# Patient Record
Sex: Female | Born: 1950 | Race: White | Hispanic: No | Marital: Married
Health system: Midwestern US, Community
[De-identification: ages and names within clinical notes are randomized; demographics above are authoritative.]

## PROBLEM LIST (undated history)

## (undated) DIAGNOSIS — E7800 Pure hypercholesterolemia, unspecified: Secondary | ICD-10-CM

## (undated) DIAGNOSIS — N390 Urinary tract infection, site not specified: Secondary | ICD-10-CM

## (undated) DIAGNOSIS — M329 Systemic lupus erythematosus, unspecified: Secondary | ICD-10-CM

## (undated) DIAGNOSIS — K912 Postsurgical malabsorption, not elsewhere classified: Secondary | ICD-10-CM

## (undated) DIAGNOSIS — E538 Deficiency of other specified B group vitamins: Secondary | ICD-10-CM

## (undated) DIAGNOSIS — R079 Chest pain, unspecified: Secondary | ICD-10-CM

## (undated) DIAGNOSIS — E559 Vitamin D deficiency, unspecified: Secondary | ICD-10-CM

## (undated) DIAGNOSIS — R5383 Other fatigue: Secondary | ICD-10-CM

## (undated) DIAGNOSIS — E785 Hyperlipidemia, unspecified: Secondary | ICD-10-CM

## (undated) DIAGNOSIS — I341 Nonrheumatic mitral (valve) prolapse: Secondary | ICD-10-CM

## (undated) HISTORY — PX: GASTRIC BYPASS: SHX52

## (undated) HISTORY — PX: CHOLECYSTECTOMY: SHX55

## (undated) HISTORY — PX: ABDOMINAL HYSTERECTOMY: SHX81

---

## 2008-09-15 LAB — METABOLIC PANEL, COMPREHENSIVE
A-G Ratio: 1 — ABNORMAL LOW (ref 1.1–2.2)
ALT (SGPT): 36 U/L (ref 30–65)
AST (SGOT): 19 U/L (ref 15–37)
Albumin: 3.2 g/dL — ABNORMAL LOW (ref 3.5–5.0)
Alk. phosphatase: 88 U/L (ref 50–136)
Anion gap: 6 mmol/L (ref 5–15)
BUN/Creatinine ratio: 15 (ref 12–20)
BUN: 9 MG/DL (ref 6–20)
Bilirubin, total: 0.5 MG/DL (ref <1.0)
CO2: 29 MMOL/L (ref 21–32)
Calcium: 8.6 MG/DL (ref 8.5–10.1)
Chloride: 106 MMOL/L (ref 97–108)
Creatinine: 0.6 MG/DL (ref 0.6–1.3)
GFR est AA: >60 mL/min/{1.73_m2} (ref >60)
GFR est non-AA: >60 mL/min/{1.73_m2} (ref >60)
Globulin: 3.2 g/dL (ref 2.0–4.0)
Glucose: 101 MG/DL — ABNORMAL HIGH (ref 50–100)
Potassium: 4.4 MMOL/L (ref 3.5–5.1)
Protein, total: 6.4 g/dL (ref 6.4–8.2)
Sodium: 141 MMOL/L (ref 136–145)

## 2008-09-15 LAB — IRON PROFILE
Iron % saturation: 13 % — ABNORMAL LOW (ref 20–50)
Iron: 54 ug/dL (ref 35–150)
TIBC: 406 ug/dL (ref 250–450)

## 2008-09-15 LAB — CBC W/O DIFF
HCT: 37 % (ref 35.0–47.0)
HGB: 12 g/dL (ref 11.5–16.0)
MCH: 26.7 PG (ref 26.0–34.0)
MCHC: 32.4 g/dL (ref 30.0–36.5)
MCV: 82.2 FL (ref 80.0–99.0)
PLATELET: 254 10*3/uL (ref 150–400)
RBC: 4.5 M/uL (ref 3.80–5.20)
RDW: 16.1 % — ABNORMAL HIGH (ref 11.5–14.5)
WBC: 5 10*3/uL (ref 3.6–11.0)

## 2008-09-15 LAB — SED RATE (ESR): Sed rate (ESR): 17 MM/HR (ref 0–30)

## 2008-09-16 LAB — VITAMIN B12 & FOLATE
Folate: 13.4 ng/mL (ref 5.4–24.0)
Vitamin B12: 214 pg/mL (ref 211–911)

## 2008-09-17 LAB — HIV-1 RNA QL
HIV 1 Ab: NEGATIVE
HIV1 ANTIBODY,HIVR: NEGATIVE

## 2008-09-17 LAB — VITAMIN D, 25 HYDROXY: Vitamin D 25-Hydroxy: 13 ng/mL — ABNORMAL LOW (ref 30–80)

## 2008-09-18 LAB — ANA BY MULTIPLEX FLOW IA, QL
ANA, Direct: NOT DETECTED
ANA: NOT DETECTED

## 2008-09-18 LAB — HSV TYPE 2-SPECIFIC ABS, IGG W/REFL SUPPLEMENTAL TESTING: HSV-2 Glycoprotein, IgG: 7.84 IV

## 2009-03-07 LAB — GLUCOSE, RANDOM: Glucose: 87 MG/DL (ref 65–100)

## 2009-03-07 LAB — LIPID PANEL
CHOL/HDL Ratio: 5.1 — ABNORMAL HIGH (ref 0–5.0)
Cholesterol, total: 375 MG/DL — ABNORMAL HIGH (ref <200)
HDL Cholesterol: 74 MG/DL
LDL, calculated: 285 MG/DL — ABNORMAL HIGH (ref 0–100)
Triglyceride: 80 MG/DL (ref <150)
VLDL, calculated: 16 MG/DL

## 2009-03-07 LAB — IRON PROFILE
Iron % saturation: 16 % — ABNORMAL LOW (ref 20–50)
Iron: 74 ug/dL (ref 35–150)
TIBC: 454 ug/dL — ABNORMAL HIGH (ref 250–450)

## 2009-03-08 LAB — VITAMIN B12 & FOLATE
Folate: 15.2 ng/mL (ref 5.0–21.0)
Vitamin B12: 305 pg/mL (ref 254–1320)

## 2009-03-08 LAB — FOLLICLE STIMULATING HORMONE: FSH: 17.9 m[IU]/mL

## 2009-03-09 LAB — LEAD, ADULT, WHOLE BLOOD: Lead: <2 ug/dL (ref 0.0–24.9)

## 2009-03-09 LAB — VITAMIN D, 25 HYDROXY: Vitamin D 25-Hydroxy: 17 ng/mL — ABNORMAL LOW (ref 30–80)

## 2009-03-10 NOTE — Procedures (Signed)
Procedures  filed by Corky Sing U at 03/14/09 1717                Author: Corky Sing U  Service: --  Author Type: Physician       Filed: 03/14/09 1717  Date of Service: 03/10/09 1141  Status: Addendum          Editor: Corky Sing U            Procedure Orders        1. ECHO EXERCISE STRESS [[id number]] ordered by  at 03/10/09 1141                         <!--EPICS--> <BR>                       [facility]<BR>                               [address]<BR>                              [place], [place] [address]<BR> <BR> <BR> Name:      Casey Cowan, Casey Cowan               Service Date:    03/09/2009<BR> DOB:       [birth year]                   Ordered by:      Thornell Mule. Phylliss Bob, MD<BR> MR #:      [id number]                    Location:<BR> Sex:        F                            Age:             57<BR> Billing #: [id number]                 Date of Adm:     03/09/2009<BR> <BR> <BR>                       STRESS ECHOCARDIOGRAPHY REPORT<BR> <BR> INDICATION:  Exertional chest pain.<BR> <BR> MEDICATIONS:<BR>  <BR> Baseline Tracing Supine:<BR> Standing BP:<BR> Sitting  BP:<BR> <BR> Smoke:<BR> Hypertensive:<BR> Diabetic:<BR> Cholesterol:<BR> <BR> Baseline Tracing Standing - Limb leads Attached To Trunk:<BR> <BR>              STAGE   mph   %GRADE   MIN   HEART       BP      ST SEGMENT<BR>                                            RATE              SYMPTOM(S)<BR> Preliminary                                 57    140/86<BR> Progressive    I     1.7  10      3     136    168/92<BR> Multistage    II     2.5      12      2      181    208/100<BR> Continuous    III    3.4     14<BR> Exercise      IV<BR> <BR> Standing Post Exercise<BR> Sitting Post Exercise                       106 @   185/87<BR>                                              3:45<BR> <BR> 111%  of predicted max heart rate (Predicted max 162/min) 137=85%<BR> 14,782 - &quot;Double Product&quot; (max systolic pressure x max heart rate)<BR> 6.8 METS  reached at maximum effort<BR> <BR> REPORT:<BR>    1. Resting ECG is normal.<BR>    2. Functional  capacity is normal.<BR>    3. Heart rate response to exercise is appropriate.<BR>    4. Blood pressure response to exercise demonstrates normal resting blood<BR>    pressure-appropriate response.<BR>    5. There was no chest pain demonstrated.<BR>     6. No arrhythmias were detected.<BR>    7. Depression upsloping 2mm ST changes were demonstrated.<BR> <BR> <BR> IMPRESSION:  Normal stress Echocardiogram.<BR> <BR> <BR> <BR> E-Signed By<BR> Lajoyce Corners, M.D. 03/14/2009 17:16<BR> Mike Craze Daisy Lazar,  M.D.<BR> <BR> cc:  Lajoyce Corners, M.D.<BR>    Joy P. Rowe, MD<BR> <BR> <BR> <BR> [facility]/AW; D:  03/09/2009  8:36 A; T:  03/10/2009 11:41 A; DOC# [id number]; JOB#<BR> [id number]<BR> <!--EPICE-->

## 2009-03-10 NOTE — Procedures (Addendum)
[facility]   [address]   [place], [place] [address]      Name: Casey Cowan, Casey Cowan Service Date: 03/09/2009  DOB: July 14, 1951 Ordered by: Thornell Mule. Phylliss Bob, MD  MR #: [id number] Location:  Sex: F Age: 58  Billing #: [id number] Date of Adm: 03/09/2009       STRESS ECHOCARDIOGRAPHY REPORT    INDICATION: Exertional chest pain.    MEDICATIONS:    Baseline Tracing Supine:  Standing BP:  Sitting BP:    Smoke:  Hypertensive:  Diabetic:  Cholesterol:    Baseline Tracing Standing - Limb leads Attached To Trunk:     STAGE mph %GRADE MIN HEART BP ST SEGMENT   RATE SYMPTOM(S)  Preliminary 57 140/86  Progressive I 1.7 10 3  136 168/92  Multistage II 2.5 12 2  181 208/100  Continuous III 3.4 14  Exercise IV    Standing Post Exercise  Sitting Post Exercise 106 @ 185/87   3:45    111% of predicted max heart rate (Predicted max 162/min) 137=85%  47,425 - "Double Product" (max systolic pressure x max heart rate)  6.8 METS reached at maximum effort    REPORT:   1. Resting ECG is normal.   2. Functional capacity is normal.   3. Heart rate response to exercise is appropriate.   4. Blood pressure response to exercise demonstrates normal resting blood   pressure-appropriate response.   5. There was no chest pain demonstrated.   6. No arrhythmias were detected.   7. Depression upsloping 2mm ST changes were demonstrated.      IMPRESSION: Normal stress Echocardiogram.        E-Signed By  Lajoyce Corners, M.D. 03/14/2009 17:16  Lajoyce Corners, M.D.    cc: Lajoyce Corners, M.D.   Joy P. Phylliss Bob, MD        [facility]/AW; D: 03/09/2009 8:36 A; T: 03/10/2009 11:41 A; DOC# [id number]; JOB#  [id number]

## 2010-06-22 MED ORDER — ROSUVASTATIN 40 MG TAB
40 mg | ORAL_TABLET | ORAL | Status: DC
Start: 2010-06-22 — End: 2011-07-22

## 2010-08-16 NOTE — Telephone Encounter (Signed)
Due for labs and visit 6 months since last.

## 2010-08-17 MED ORDER — ERGOCALCIFEROL (VITAMIN D2) 50,000 UNIT CAP
1250 mcg (50,000 unit) | ORAL_CAPSULE | ORAL | Status: DC
Start: 2010-08-17 — End: 2010-11-12

## 2010-08-17 NOTE — Telephone Encounter (Signed)
Was due for appt 4/11  D/w pt - will call to make an appt

## 2010-10-05 ENCOUNTER — Telehealth

## 2010-10-05 NOTE — Telephone Encounter (Signed)
Message copied by Sylvie Farrier on Fri Oct 05, 2010 11:46 AM  ------       Message from: Diamantina Monks       Created: Tue Oct 02, 2010  2:50 PM       Regarding: FW: please mail lab request to patient       Contact: [phone]         Can you please send a lab slip for  Routine bariatric labs - thanks       ----- Message -----          From: Darrell Jewel          Sent: 10/02/2010  11:57 AM            To: Diamantina Monks, NP       Subject: please mail lab request to patient                         Would like to have labs done before her appt. Feb.21,11:00. flo

## 2010-10-05 NOTE — Telephone Encounter (Signed)
Lab slip mailed to patient per J.Moss request.

## 2010-10-31 LAB — METABOLIC PANEL, COMPREHENSIVE
A-G Ratio: 1.5 (ref 1.1–2.5)
ALT (SGPT): 25 IU/L (ref 0–40)
AST (SGOT): 29 IU/L (ref 0–40)
Albumin: 4 g/dL (ref 3.6–4.8)
Alk. phosphatase: 74 IU/L (ref 25–165)
BUN/Creatinine ratio: 22 (ref 11–26)
BUN: 13 mg/dL (ref 8–27)
Bilirubin, total: 0.4 mg/dL (ref 0.0–1.2)
CO2: 25 mmol/L (ref 20–32)
Calcium: 9 mg/dL (ref 8.6–10.2)
Chloride: 103 mmol/L (ref 97–108)
Creatinine: 0.6 mg/dL (ref 0.57–1.00)
GFR est AA: 115 mL/min/{1.73_m2} (ref >59)
GFR est non-AA: 99 mL/min/{1.73_m2} (ref >59)
GLOBULIN, TOTAL: 2.6 g/dL (ref 1.5–4.5)
Glucose: 91 mg/dL (ref 65–99)
Potassium: 4.3 mmol/L (ref 3.5–5.2)
Protein, total: 6.6 g/dL (ref 6.0–8.5)
Sodium: 139 mmol/L (ref 134–144)

## 2010-10-31 LAB — CBC W/O DIFF
HCT: 38.9 % (ref 34.0–44.0)
HGB: 12.3 g/dL (ref 11.5–15.0)
MCH: 26.5 pg — ABNORMAL LOW (ref 27.0–34.0)
MCHC: 31.6 g/dL — ABNORMAL LOW (ref 32.0–36.0)
MCV: 84 fL (ref 80–98)
PLATELET: 266 10*3/uL (ref 140–415)
RBC: 4.65 x10E6/uL (ref 3.80–5.10)
RDW: 14.8 % (ref 11.7–15.0)
WBC: 5.9 10*3/uL (ref 4.0–10.5)

## 2010-10-31 LAB — VITAMIN D, 25 HYDROXY: VITAMIN D, 25-HYDROXY: 15.9 ng/mL — ABNORMAL LOW (ref 30.0–100.0)

## 2010-10-31 LAB — IRON: Iron: 74 ug/dL (ref 35–155)

## 2010-10-31 LAB — PTH INTACT: PTH, Intact: 95 pg/mL — ABNORMAL HIGH (ref 15–65)

## 2010-10-31 LAB — VITAMIN B12 & FOLATE
Folate: 11.3 ng/mL (ref >3.0)
Vitamin B12: 191 pg/mL — ABNORMAL LOW (ref 211–946)

## 2010-10-31 NOTE — Telephone Encounter (Signed)
Quick Note:    Missed her appointment and needs to follow up.    B12 and D are very low. Low D is contributing to poor absorption of calcium    B12 1000 mcg/ml 1 ml IM Q week x 4 then Q month #6 doses / 9R with needles and syringes if she is going to self administer  Or   Nascobal 1 sq 1 nostril Q week #1 bottle / 5 R    Vit D 50, 000 iu 1 PO 2x/ week #24/1R    Calcium 500 mg 2 PO tid w/ meals  MVI with iron BID    Need to reassess labs in 3 months  ______

## 2010-11-02 ENCOUNTER — Encounter

## 2010-11-02 MED ORDER — CYANOCOBALAMIN (VIT B-12) 500 MCG/SPRAY NASAL SPRAY
500 mcg/spray | NASAL | Status: DC
Start: 2010-11-02 — End: 2011-04-10

## 2010-11-02 MED ORDER — ERGOCALCIFEROL (VITAMIN D2) 50,000 UNIT CAP
1250 mcg (50,000 unit) | ORAL_CAPSULE | ORAL | Status: DC
Start: 2010-11-02 — End: 2011-04-10

## 2010-11-02 NOTE — Progress Notes (Signed)
No show

## 2010-11-05 NOTE — Telephone Encounter (Signed)
I called and left a voice mail for the patient to call me back regarding her lab work.

## 2010-11-05 NOTE — Telephone Encounter (Signed)
Pt called back and she said that she spoke to another nurse already about her labs and vitamin prescriptions. She will follow up in the office next week.

## 2010-11-05 NOTE — Telephone Encounter (Signed)
Message copied by Providence Lanius on Mon Nov 05, 2010 10:01 AM  ------       Message from: Diamantina Monks       Created: Wed Oct 31, 2010  4:40 PM         Missed her appointment and needs to follow up.              B12 and D are very low.  Low D is contributing to poor absorption of calcium              B12 1000 mcg/ml 1 ml IM Q week x 4 then Q month #6 doses / 9R with needles and syringes if she is going to self administer       Or        Nascobal 1 sq 1 nostril Q week #1 bottle / 5 R              Vit D 50, 000 iu 1 PO 2x/ week #24/1R              Calcium 500 mg 2 PO tid w/ meals       MVI with iron BID              Need to reassess labs in 3  months

## 2010-11-12 MED ORDER — NYSTATIN 100,000 UNIT/G OINTMENT
100000 unit/gram | Freq: Two times a day (BID) | CUTANEOUS | Status: DC
Start: 2010-11-12 — End: 2011-08-16

## 2010-11-12 NOTE — Patient Instructions (Signed)
Choose foods wisely.  Think about the nutritional benefit of the food.    Protein first and at each meal.  Include produce (vegetables and/or fruits) at each meal.    Limit or eliminate "filler" foods such as breads, pasta, potatoes, rice, crackers, pretzels, and the like.  Avoid eating and drinking together, there just isn't enough room!  You may continue protein supplementation if needed to meet your daily protein goals.  Many patients use a protein shake or bar to replace a meal.  There are a variety of protein supplements listed in your education book.  Take your vitamins every day, attend support group and keep your regular follow-up appointments.  Ultimately, success depends on you.  Choose to use your tool and we will guide you along the way!

## 2010-11-12 NOTE — Progress Notes (Signed)
Chief Complaint   Patient presents with   ??? Surgical Follow-up     8 years 8 months s/p gastric bypass with weight regain     Patient is almost 9 years status post gastric bypass.  Presents today for routine follow-up.  Patient has had significant weight regain over the past several years.  She came back today because of significant memory issues.  She has a B12 deficiency and is now using Nascobal.  She has been haphazzard with her vitamins over the year.  She has a Vitamin D deficiency and has started twice weekly Vitamin D 50, 000 iu.  She reports her low weight was 189 lbs and she has been at her current weight for the past 3 years.  She eats more carbohydrates and drinks coffee all day.  She has started working again and having difficulty keeping up.  Bowels moving 1-4 times daily.  Patient has had rare episodes of dysphagia due to poor chewing, eating too quickly and/or other behavioral factors.  She has a husband that loves her with food and they eat a large nightly meal around 9:30pm.  He has baked goods all over the kitchen and she cannot resist.    She is having rashes along her skin folds, mostly the abdomen.  She says it gets itchy and has an odor.    BP 140/90   Ht 5' 4.5" (1.638 m)   Wt 244 lb (110.678 kg)   BMI 41.24 kg/m2  A + O x 3, obese AA female  Chest  CTA  COR  RRR  ABD Soft, NT/ND, +BS, no masses or hernias.  EXT 1+ LE edema; ambulating independently    1. Other and unspecified postsurgical nonabsorption    2. Vitamin D deficiency    3. Anemia, B12 deficiency    4. BMI 40.0-44.9, adult    5. Weight gain    6. Hyperlipidemia    intertrigo:  Mycostatin ointment BID PRN  Labs reviewed and will reassess B12 and Vitamin D in 6 weeks  Continue other daily vitamins  Referred to dietician / reactive hypoglcemia  Follow up in 6 weeks  If no improvement in memory with B12 replacement refer back to primary care for medical management   Choose foods wisely.  Think about the nutritional benefit of the food.    Protein first and at each meal.  Include produce (vegetables and/or fruits) at each meal.    Limit or eliminate "filler" foods such as breads, pasta, potatoes, rice, crackers, pretzels, and the like.  Avoid eating and drinking together, there just isn't enough room!  You may continue protein supplementation if needed to meet your daily protein goals.  Many patients use a protein shake or bar to replace a meal.  There are a variety of protein supplements listed in your education book.  Take your vitamins every day, attend support group and keep your regular follow-up appointments.  Ultimately, success depends on you.  Choose to use your tool and we will guide you along the way!

## 2011-01-01 NOTE — Progress Notes (Signed)
No show

## 2011-03-08 ENCOUNTER — Telehealth

## 2011-03-08 NOTE — Telephone Encounter (Signed)
Message copied by Sylvie Farrier on Fri Mar 08, 2011  1:17 PM  ------       Message from: Theodore Demark       Created: Fri Mar 08, 2011  9:54 AM       Regarding: Phone call       Contact: [phone]         Ms. Sweeten just called asking to speak to [name] and I told her Eilene Ghazi was on vacation.  She had requested that all of her reports, etc. be faxed to her PCP and wanted to know if this had been done.  Please call her [phone].

## 2011-03-08 NOTE — Telephone Encounter (Signed)
Returned patient's call, will fax lab results from February to PCP Dr. Phylliss Bob. Will also mail a lab slip for patient to repeat labs; advised patient to follow up, patient stated that she will call back to schedule.

## 2011-04-04 LAB — IRON: Iron: 60 ug/dL (ref 35–155)

## 2011-04-04 LAB — VITAMIN B12 & FOLATE
Folate: 12.6 ng/mL (ref >3.0)
Vitamin B12: 1098 pg/mL — ABNORMAL HIGH (ref 211–946)

## 2011-04-04 LAB — CBC W/O DIFF
HCT: 40.9 % (ref 34.0–46.6)
HGB: 12.9 g/dL (ref 11.1–15.9)
MCH: 26.8 pg (ref 26.6–33.0)
MCHC: 31.5 g/dL (ref 31.5–35.7)
MCV: 85 fL (ref 79–97)
PLATELET: 225 10*3/uL (ref 140–415)
RBC: 4.81 x10E6/uL (ref 3.77–5.28)
RDW: 14.5 % (ref 12.3–15.4)
WBC: 4.8 10*3/uL (ref 4.0–10.5)

## 2011-04-04 LAB — METABOLIC PANEL, COMPREHENSIVE
A-G Ratio: 1.5 (ref 1.1–2.5)
ALT (SGPT): 244 IU/L — ABNORMAL HIGH (ref 0–40)
AST (SGOT): 448 IU/L — ABNORMAL HIGH (ref 0–40)
Albumin: 3.9 g/dL (ref 3.6–4.8)
Alk. phosphatase: 136 IU/L (ref 25–165)
BUN/Creatinine ratio: 17 (ref 11–26)
BUN: 11 mg/dL (ref 8–27)
Bilirubin, total: 0.6 mg/dL (ref 0.0–1.2)
CO2: 27 mmol/L (ref 20–32)
Calcium: 9.1 mg/dL (ref 8.6–10.2)
Chloride: 102 mmol/L (ref 97–108)
Creatinine: 0.65 mg/dL (ref 0.57–1.00)
GFR est non-AA: 97 mL/min/{1.73_m2} (ref >59)
GLOBULIN, TOTAL: 2.6 g/dL (ref 1.5–4.5)
Glucose: 108 mg/dL — ABNORMAL HIGH (ref 65–99)
Potassium: 5 mmol/L (ref 3.5–5.2)
Protein, total: 6.5 g/dL (ref 6.0–8.5)
Sodium: 138 mmol/L (ref 134–144)
eGFR If African American: 112 mL/min/{1.73_m2} (ref >59)

## 2011-04-04 LAB — VITAMIN D, 25 HYDROXY: VITAMIN D, 25-HYDROXY: 28.4 ng/mL — ABNORMAL LOW (ref 30.0–100.0)

## 2011-04-04 LAB — PTH INTACT: PTH, Intact: 34 pg/mL (ref 15–65)

## 2011-04-05 NOTE — Telephone Encounter (Signed)
Pt wanted to contact the office to inform that she had an office visit on Monday with Dr. Phylliss Bob and wanted to make sure that lab work was received from [facility]. Advised pt that there were labs within the system. Pt stated that she would discuss her concerns, the same that she has been having oat [facility], with the doctor on Monday.

## 2011-04-08 NOTE — Telephone Encounter (Signed)
Message copied by Sylvie Farrier on Mon Apr 08, 2011 10:07 AM  ------       Message from: Teodoro Spray C       Created: Mon Apr 08, 2011  9:16 AM       Regarding: Phone call       Contact: [phone]         Ms. Huq just called asking to speak to [place].  She said that she is seeing her PCP today and was wondering if she could have her lab results faxed to her PCP today.  Please call her [phone].

## 2011-04-08 NOTE — Telephone Encounter (Signed)
Message copied by Diamantina Monks on Mon Apr 08, 2011 12:27 PM  ------       Message from: Alene Mires E       Created: Mon Apr 08, 2011 11:06 AM       Regarding: Give Mrs. Leather a call when you can       Contact: [phone]         Evlyn Kanner call Mrs. Gartin when you can she has some lab results she would like to discuss some things with you before her appointment coming up thanks.

## 2011-04-08 NOTE — Patient Instructions (Signed)
MyChart Activation    Thank you for requesting access to MyChart. Please follow the instructions below to securely access and download your online medical record. MyChart allows you to send messages to your doctor, view your test results, renew your prescriptions, schedule appointments, and more.    How Do I Sign Up?    1. In your internet browser, go to [email or web address].  2. Click on the First Time User? Click Here link in the Sign In box. You will see the New Member Sign Up page.  3. Enter your MyChart Access Code exactly as it appears below. You will not need to use this code after you???ve completed the sign-up process. If you do not sign up before the expiration date, you must request a new code.    MyChart Access Code: [email or web address]  Expires: 07/07/2011  9:16 AM (This is the date your MyChart access code will expire)    4. Enter the last four digits of your Social Security Number (xxxx) and Date of Birth (mm/dd/yyyy) as indicated and click Submit. You will be taken to the next sign-up page.  5. Create a MyChart ID. This will be your MyChart login ID and cannot be changed, so think of one that is secure and easy to remember.  6. Create a MyChart password. You can change your password at any time.  7. Enter your Password Reset Question and Answer. This can be used at a later time if you forget your password.   8. Enter your e-mail address. You will receive e-mail notification when new information is available in MyChart.  9. Click Sign Up. You can now view and download portions of your medical record.  10. Click the Download Summary menu link to download a portable copy of your medical information.    Additional Information    If you have questions, please visit the Frequently Asked Questions section of the MyChart website at [email or web address] Remember, MyChart is NOT to be used for urgent needs. For medical emergencies, dial 911.

## 2011-04-08 NOTE — Telephone Encounter (Signed)
Quick Note:    Have left several messages with patient about labs.  Liver enzymes are up significantly and this needs to be evaluated  Vit D is low and should be taking Vit D 5000 iu OTC QD  Other vitamins are ok / stay on daily supplements  ______

## 2011-04-08 NOTE — Progress Notes (Signed)
HISTORY OF PRESENT ILLNESS  Casey Cowan is a 60 y.o. female.  HPI  The patient has hyperlipidemia. She last came in for a visit for this 6/10 and was off meds.  Refills were given but she did not keep planned follow up. Diet and Lifestyle: generally follows a low fat low cholesterol diet, generally follows a low sodium diet, exercises sporadically, carbs are her difficulty. Home BP Monitoring: has always been normal. She reports taking medications as instructed, no medication side effects noted, no chest pain on exertion, no dyspnea on exertion, no swelling of ankles, no orthopnea or paroxysmal nocturnal dyspnea, no palpitations. Lab review: labs reviewed and discussed with patient.    She is back to working full time.  She had not been feeling well and went back to see [facility] for evaluation and was found to have multiple nutrient deficiencies in 2/12.  Supplements were restarted and she only feels a little better.  She reports memory problems at times recently.  She has trouble with word finding.  This is causing problems in the work place.  She has to miss work due to not feeling well.    We reviewed her labs done recently showing increase in LFT's with no changes in her regimen except getting back on her supplements.  She never came for follow up after she started the Crestor 2 years ago and was able to get refills from her pharmacy until recently.    Patient Active Problem List   Diagnoses Date Noted   ??? Status following gastric bypass for weight loss 11/12/2010   ??? Anemia, B12 deficiency 11/12/2010   ??? Vitamin D deficiency 11/12/2010     Current Outpatient Prescriptions   Medication Sig Dispense Refill   ??? aspirin 81 mg tablet Take 81 mg by mouth daily.         ??? multivitamin (ONE A DAY) tablet Take 1 Tab by mouth daily.         ??? CALCIUM CARBONATE/VITAMIN D3 (CALTRATE 600 + D PO) Take  by mouth.          ??? nystatin (MYCOSTATIN) 100,000 unit/g ointment Apply  to affected area two (2) times a day.  60 g  2   ??? cyanocobalamin (NASCOBAL) 500 mcg Spry 1 Squirt by Nasal route every seven (7) days. 1 squirt in 1 nostril.  1 Bottle  5   ??? ergocalciferol (VITAMIN D) 50,000 unit capsule Take 1 Cap by mouth every Tuesday and Thursday.  24 Cap  1   ??? rosuvastatin (CRESTOR) 40 mg tablet TAKE ONE TABLET BY MOUTH EVERY DAY  30 Tab  5     Allergies   Allergen Reactions   ??? Iv Dye, Iodine Containing Contrast Media Hives and Other (comments)     fever   ??? Pcn (Penicillins) Other (comments)     Lethargic and chills   ??? Zocor (Simvastatin) Myalgia           Review of Systems   Respiratory: Negative for cough and shortness of breath.    Cardiovascular: Negative for chest pain and palpitations.   Gastrointestinal: Negative for abdominal pain and constipation. Diarrhea: but no change since her gastric bypass surgery.        Completed hepatitis B series of vaccines in the past.   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   Neurological: Negative for sensory change and speech change (except word finding).   Endo/Heme/Allergies: Polydipsia: purposeful.   Psychiatric/Behavioral: Insomnia: no sleep  apnea symptoms.         Trouble with sleeping on her right side or on her back as she becomes short of breath     Physical Exam   Constitutional: She appears well-developed and well-nourished.   Neck: No JVD present.   Cardiovascular: Normal rate, regular rhythm, normal heart sounds and intact distal pulses.  Exam reveals no gallop and no friction rub.    No murmur heard.  Pulmonary/Chest: Effort normal and breath sounds normal. She has no wheezes. She has no rales.   Abdominal: Soft. Bowel sounds are normal. She exhibits no mass. There is no tenderness.        Liver difficult to assess due to girth but appears to have a normal span   Musculoskeletal: She exhibits no edema.      BP 130/82   Ht 5\' 4"  (1.626 m)   Wt 241 lb (109.317 kg)   BMI 41.37 kg/m2    ASSESSMENT and PLAN  1. Other and unspecified hyperlipidemia  We discussed plans for routine follow up for medication management; CK, LIPID PANEL   2. Elevated liver enzymes  Unclear reason; first will repeat. HEPATIC FUNCTION PANEL, HEPATITIS PANEL, ACUTE; consider referral for evaluation   3. Memory loss  My be situational or may be related to underlying disease of which the liver findings are part

## 2011-04-08 NOTE — Telephone Encounter (Signed)
Returned patient's call, no answer. Left message that I faxed labs to Dr. Phylliss Bob.

## 2011-04-08 NOTE — Telephone Encounter (Signed)
LM to speak with patient.  LFTs up and D is low.  She has not been in follow up in > 6 months

## 2011-04-09 LAB — HEPATIC FUNCTION PANEL
ALT (SGPT): 80 IU/L — ABNORMAL HIGH (ref 0–40)
AST (SGOT): 47 IU/L — ABNORMAL HIGH (ref 0–40)
Albumin: 3.9 g/dL (ref 3.6–4.8)
Alk. phosphatase: 105 IU/L (ref 25–165)
Bilirubin, direct: 0.15 mg/dL (ref 0.00–0.40)
Bilirubin, total: 0.6 mg/dL (ref 0.0–1.2)
Protein, total: 6.6 g/dL (ref 6.0–8.5)

## 2011-04-09 LAB — HEPATITIS PANEL, ACUTE
Hep B Core Ab, IgM: NEGATIVE
Hep B surface Ag screen: NEGATIVE
Hep C Virus Ab: <0.1 s/co ratio (ref 0.0–0.9)
Hepatitis A Ab, IgM: NEGATIVE

## 2011-04-09 LAB — LIPID PANEL
Cholesterol, total: 298 mg/dL — ABNORMAL HIGH (ref 100–199)
HDL Cholesterol: 85 mg/dL (ref >39)
LDL, calculated: 202 mg/dL — ABNORMAL HIGH (ref 0–99)
Triglyceride: 56 mg/dL (ref 0–149)
VLDL, calculated: 11 mg/dL (ref 5–40)

## 2011-04-09 LAB — CK: Creatine Kinase,Total: 113 U/L (ref 24–173)

## 2011-04-09 NOTE — Telephone Encounter (Signed)
LM

## 2011-04-09 NOTE — Telephone Encounter (Signed)
Message copied by Diamantina Monks on Tue Apr 09, 2011  4:03 PM  ------       Message from: Theodore Demark       Created: Tue Apr 09, 2011  9:49 AM       Regarding: Phone call       Contact: [phone]         Casey Cowan is sorry she missed your call yesterday.  She is concerned about her labs (liver function) and would like for you to call her [phone].

## 2011-04-10 MED ORDER — CYANOCOBALAMIN (VIT B-12) 500 MCG/SPRAY NASAL SPRAY
500 mcg/spray | NASAL | Status: DC
Start: 2011-04-10 — End: 2012-03-31

## 2011-04-10 MED ORDER — ERGOCALCIFEROL (VITAMIN D2) 50,000 UNIT CAP
1250 mcg (50,000 unit) | ORAL_CAPSULE | ORAL | Status: DC
Start: 2011-04-10 — End: 2012-03-31

## 2011-04-10 NOTE — Progress Notes (Signed)
Chief Complaint   Patient presents with   ??? Surgical Follow-up     9 years post lap gastric bypass     Patient is 9 years status post gastric byoass.  Presents today with concerns for recent elevation in her LFTs, memory loss and job stress.  She had significant elevation in her liver enzymes the end of July.  They were recently rechecked and now are almost normal.  May have been associated with her Crestor and she has stopped taking it.  She denies Tylenol or NSAID use.  No ETOH.  She had no precluding illness, fevers, jaundice, pain, nausea or vomiting.  She had a bout of diarrhea that lasted 2 days, but no black or bloody stools.    She has had her gallbladder removed and denies pain.    Dr. Phylliss Bob is following along her enzymes.  She had a negative hepatitis profile.  Her secondary concern is continued issues with her memory.  She forgot her home phone number, is having difficulty at work and is having trouble with placing a name to common household objects.  She has been counseled at work and is concerned she may lose her job.  She admits to a high stress work environment, but is worried her issues are "more than that".  She has a history of pernicious anemia and D deficiency, but this has been corrected.  She reports she has been better with taking her vitamins.  She briefly mentioned her weight and was "glad you aren't fussing at me about it".  Patient has regained a significant amount of weight post bypass.      BP 158/98   Ht 5\' 4"  (1.626 m)   Wt 243 lb 8 oz (110.451 kg)   BMI 41.80 kg/m2  A + O x 3, obese AA female, well groomed  No jaundice  Chest  CTA  COR  RRR  ABD Soft, obese, NT/ND, +BS, no masses or hernias.  EXT Trace pedal edema; ambulating independently    1. Elevated LFTs     2. Other and unspecified postsurgical nonabsorption     3. Memory difficulties     4. Vitamin B12 deficiency  cyanocobalamin (NASCOBAL) 500 mcg Spry   5. Vitamin D deficiency  ergocalciferol (VITAMIN D2) 50,000 unit capsule    6. BMI 40.0-44.9, adult       Liver enzymes now close to normal range and may have been related to Crestor - she will follow up with Dr. Phylliss Bob about lipid management  B12 deficiency - corrected and she will continue Nascobal weekly nasal spray  Vit D deficiency - Continue Vit D 50, 000 iu weekly and reassess in 3 months  Memory difficulties - gave her names to consider memory / neurologic evaluation.  Rodolph Bong, neuropsychologist and he does extensive workup for memory impairment and may be able to determine stress/anxiety issue vs true impairment.  Also given [facility]  Bariatric follow up in 6 months  She had no interest in discussing her weight or diet today, so will defer to next time

## 2011-04-10 NOTE — Patient Instructions (Addendum)
Make and appointment with Neurology about memory issues:    Rodolph Bong (neuropsychologist)  [phone]    [facility] [phone]      Fax letter to [place], [place]  [phone]

## 2011-04-11 NOTE — Telephone Encounter (Signed)
Casey Cowan, 1951-07-13, phone [phone]  Pt says she has been talking back and forth with Dr. Phylliss Bob concerning her recent test results and she wishes to speak to her again regarding them.

## 2011-04-11 NOTE — Telephone Encounter (Signed)
Saw the nurse at [facility] who recommended further testing.  She is considering this.  Currently she is at work and would like to discuss topics she is not able to at work.  I will call her again tomorrow

## 2011-04-12 NOTE — Telephone Encounter (Signed)
Pt spouse wants you to call him to talk about pt treatment plan and about her liver count and also about the long hours at work you can call spouse at [phone]

## 2011-04-12 NOTE — Progress Notes (Signed)
Quick Note:    I do not think the Crestor was responsible for the elevation in liver tests since you were taking it when we retested your lab work and when the liver tests were normal 5 months ago. I checked on the effectiveness of Zetia in people who have had gastric bypass and there is very limited data. It is just as safe But there is limited experience in people with gastric bypass. I would like to add it to the Crestor and see if it is helpful in you if you are willing to try it.  ______

## 2011-04-23 NOTE — Telephone Encounter (Signed)
I called the patient and let her know that [name] had faxed her letter yesterday and she also mailed it to her yesterday. Pt in agreement.

## 2011-04-23 NOTE — Telephone Encounter (Signed)
She is having difficulty with job performance.  She is considering resignation or short term disability and has trouble with word finding and memory including forgetting her home phone number.  No symptoms of sleep apnea but still not sleeping well.  She has oversedation from trying sleep aids.    She will proceed with neuropsych testing.

## 2011-04-23 NOTE — Telephone Encounter (Signed)
Message copied by Providence Lanius on Tue Apr 23, 2011 11:08 AM  ------       Message from: Darrell Jewel       Created: Tue Apr 23, 2011 10:15 AM       Regarding: has not received paper work that was sent out two weeks ago       Contact: [phone]         Per Eilene Ghazi conversation still did not receive paper work sent 2wks ago.  Thanks flo

## 2011-05-02 NOTE — Telephone Encounter (Signed)
LVM for pt to inform her that the [facility] paperwork was received by Dr. Phylliss Bob and she is currently in process with filling it out. Advised pt to contact the office with any further questions or concerns.

## 2011-05-02 NOTE — Telephone Encounter (Signed)
Casey Cowan, May 14, 1951, phone [phone]  Pt says she faxed Dr. Phylliss Bob her FMLA paperwork for her to fill out on Monday, 04/29/11 and she'd like to get confirmation it was received and know the status of its completion.

## 2011-05-07 NOTE — Telephone Encounter (Signed)
Late entry- faxed FMLA paperwork 05/06/11 to [phone] as requested. Called to confirm if the forms were received and the fax machine there was out of paper. I re-faxed the information. Was told I would get a call back today 05/07/11 if it did not come through the second time.

## 2011-06-17 NOTE — Telephone Encounter (Signed)
Casey Cowan, 08-06-1951, phone: [phone]  Pt stated that she has been having ear problems since last Friday. Pt's current symptoms: aching inside ear (left ear), pain down side of ear/face, sore throat. Pt wanted to know if Dr. Phylliss Bob can prescribe her something to help with the symptoms. Pt stated that it is a bit hard for her to hear. If possible, please call something in to [facility] pharmacy listed on file.

## 2011-06-17 NOTE — Telephone Encounter (Signed)
Patient will need to be evaluated before any medications for these symptoms can be given.

## 2011-06-19 MED ORDER — AZITHROMYCIN 250 MG TAB
250 mg | ORAL_TABLET | ORAL | Status: AC
Start: 2011-06-19 — End: 2011-06-24

## 2011-06-19 NOTE — Patient Instructions (Signed)
MyChart Activation    Thank you for requesting access to MyChart. Please follow the instructions below to securely access and download your online medical record. MyChart allows you to send messages to your doctor, view your test results, renew your prescriptions, schedule appointments, and more.    How Do I Sign Up?    1. In your internet browser, go to [email or web address]  2. Click on the First Time User? Click Here link in the Sign In box. You will be redirect to the New Member Sign Up page.  3. Enter your MyChart Access Code exactly as it appears below. You will not need to use this code after you???ve completed the sign-up process. If you do not sign up before the expiration date, you must request a new code.    MyChart Access Code: [email or web address]  Expires: 07/07/2011  9:16 AM (This is the date your MyChart access code will expire)    4. Enter the last four digits of your Social Security Number (xxxx) and Date of Birth (mm/dd/yyyy) as indicated and click Submit. You will be taken to the next sign-up page.  5. Create a MyChart ID. This will be your MyChart login ID and cannot be changed, so think of one that is secure and easy to remember.  6. Create a MyChart password. You can change your password at any time.  7. Enter your Password Reset Question and Answer. This can be used at a later time if you forget your password.   8. Enter your e-mail address. You will receive e-mail notification when new information is available in MyChart.  9. Click Sign Up. You can now view and download portions of your medical record.  10. Click the Download Summary menu link to download a portable copy of your medical information.    Additional Information    If you have questions, please visit the Frequently Asked Questions section of the MyChart website at [email or web address] Remember, MyChart is NOT to be used for urgent needs. For medical emergencies, dial 911.

## 2011-06-19 NOTE — Progress Notes (Signed)
Subjective:   Casey Cowan is a 60 y.o. female who complains of congestion, sore throat and left ear pain, fullness, hearing loss, pressure for 5 days, unchanged since that time.  She denies a history of shortness of breath and wheezing.    Evaluation to date: none.   Treatment to date: ear drops, OTC products.  Patient does not smoke cigarettes.  Relevant PMH: No pertinent additional PMH.    Current Outpatient Prescriptions   Medication Sig Dispense Refill   ??? cyanocobalamin (NASCOBAL) 500 mcg Spry 1 Squirt by Nasal route every seven (7) days. 1 squirt in 1 nostril.  1 Bottle  5   ??? ergocalciferol (VITAMIN D2) 50,000 unit capsule Take 1 Cap by mouth every seven (7) days.  12 Cap  1   ??? aspirin 81 mg tablet Take 81 mg by mouth daily.         ??? multivitamin (ONE A DAY) tablet Take 1 Tab by mouth daily.         ??? CALCIUM CARBONATE/VITAMIN D3 (CALTRATE 600 + D PO) Take  by mouth.         ??? nystatin (MYCOSTATIN) 100,000 unit/g ointment Apply  to affected area two (2) times a day.  60 g  2   ??? rosuvastatin (CRESTOR) 40 mg tablet TAKE ONE TABLET BY MOUTH EVERY DAY  30 Tab  5     Allergies   Allergen Reactions   ??? Iv Dye, Iodine Containing Contrast Media Hives and Other (comments)     fever   ??? Pcn (Penicillins) Other (comments)     Lethargic and chills   ??? Zocor (Simvastatin) Myalgia        Review of Systems  Pertinent items are noted in HPI.  We reviewed her labs. Still no GI symptoms.  Has not yet gotten testing done regarding memory loss.    Objective:     BP 130/80   Wt 248 lb (112.492 kg)  General:  alert, cooperative, no distress   Eyes: conjunctivae/corneas clear. PERRL, EOM's intact. Fundi benign   Ears: normal TM AD - , abnormal TM AS - erythematous, bulging, purulent middle ear fluid   Sinuses: Normal paranasal sinuses without tenderness   Mouth:  Lips, mucosa, and tongue normal. Teeth and gums normal and normal findings: oropharynx pink & moist without lesions or evidence of thrush    Neck: supple, symmetrical, trachea midline and no adenopathy.   Lungs: clear to auscultation bilaterally   Nares   mucosal edema         Assessment/Plan:   otitis media  Suggested symptomatic OTC remedies.  RTC prn.  1. Otitis media  azithromycin (ZITHROMAX) 250 mg tablet   2. Hypercholesteremia  HEPATIC FUNCTION PANEL, LIPID PANEL   .

## 2011-07-15 NOTE — Progress Notes (Signed)
No show

## 2011-07-23 ENCOUNTER — Telehealth

## 2011-07-23 MED ORDER — CRESTOR 40 MG TABLET
40 mg | ORAL_TABLET | ORAL | Status: DC
Start: 2011-07-23 — End: 2011-08-16

## 2011-07-23 NOTE — Telephone Encounter (Signed)
lvm for pt

## 2011-07-23 NOTE — Telephone Encounter (Signed)
Patient wants to get labs done at [facility] - needs new lab slip - please fax to home number - done

## 2011-07-23 NOTE — Telephone Encounter (Signed)
Please remind her to get her lab work done.  Let me know if she needs to request reprinted

## 2011-07-27 LAB — LIPID PANEL
Cholesterol, total: 298 mg/dL — ABNORMAL HIGH (ref 100–199)
HDL Cholesterol: 85 mg/dL (ref >39)
LDL, calculated: 202 mg/dL — ABNORMAL HIGH (ref 0–99)
Triglyceride: 54 mg/dL (ref 0–149)
VLDL, calculated: 11 mg/dL (ref 5–40)

## 2011-07-27 LAB — HEPATIC FUNCTION PANEL
ALT (SGPT): 25 IU/L (ref 0–32)
AST (SGOT): 30 IU/L (ref 0–40)
Albumin: 3.6 g/dL (ref 3.6–4.8)
Alk. phosphatase: 69 IU/L (ref 25–165)
Bilirubin, direct: 0.16 mg/dL (ref 0.00–0.40)
Bilirubin, total: 0.7 mg/dL (ref 0.0–1.2)
Protein, total: 6.4 g/dL (ref 6.0–8.5)

## 2011-07-27 MED ORDER — EZETIMIBE 10 MG TAB
10 mg | ORAL_TABLET | Freq: Every day | ORAL | Status: AC
Start: 2011-07-27 — End: ?

## 2011-07-27 NOTE — Telephone Encounter (Signed)
Adding Zetia

## 2011-07-27 NOTE — Progress Notes (Signed)
Quick Note:    Liver function tests have completely returned to normal. While the Crestor is of benefit, the LDL remains significantly elevated. I would recommend the addition of Zetia. It is unclear how effective this will be in someone who is having gastric bypass. It is safe to use. I would like to recheck her blood work again in 3 months.  ______

## 2011-07-30 NOTE — Telephone Encounter (Signed)
Refer to note in labs - pt notified

## 2011-07-30 NOTE — Telephone Encounter (Signed)
Casey Cowan, Feb 15, 1951, phone: [phone]  Pt is requesting to speak with Dr. Phylliss Bob regarding her most recent lab work and Rx's that would need to be altered based on those results. Please contact pt back, per her request.

## 2011-08-14 ENCOUNTER — Telehealth

## 2011-08-14 NOTE — Telephone Encounter (Signed)
Has a UTI & wants to know if you can fax a lab sheet to her at # [phone].

## 2011-08-14 NOTE — Telephone Encounter (Signed)
D/w pts husband.  Patient did at home uti test and it was positive.  Wants to get urine checked.  Will be at [facility] tomorrow so wants to take lab slip with her.  UA and culture ordered and faxed as requested.

## 2011-08-15 NOTE — Telephone Encounter (Signed)
Casey Cowan, 01/21/1951, phone [phone]  Pt is requesting a doctor's note for work for today's date and tomorrow's.  She's been in so much pain that she may not be able to go in until she can get medicated.  She's going to the lab this morning but has been taking Uristat to alleviate some of the symptoms.  Please fax letter to [phone].

## 2011-08-15 NOTE — Telephone Encounter (Signed)
Patient scheduled for appt at noon tomorrow - pt did not want to come in today - will continue taking uristat

## 2011-08-16 LAB — URINALYSIS W/MICROSCOPIC
Bilirubin: NEGATIVE
Blood: NEGATIVE
Glucose: NEGATIVE
Ketone: NEGATIVE
Nitrites: NEGATIVE
Protein: NEGATIVE
Specific Gravity: 1.022 (ref 1.005–1.030)
Urobilinogen: 0.2 mg/dL (ref 0.0–1.9)
pH (UA): 6 (ref 5.0–7.5)

## 2011-08-16 LAB — AMB POC URINALYSIS DIP STICK AUTO W/O MICRO
Bilirubin (UA POC): NEGATIVE
Blood (UA POC): NEGATIVE
Glucose (UA POC): NEGATIVE
Ketones (UA POC): NEGATIVE
Nitrites (UA POC): NEGATIVE
Protein (UA POC): NEGATIVE mg/dL
Specific gravity (UA POC): 1.01 (ref 1.001–1.035)
Urobilinogen (UA POC): 0.2 (ref 0.2–1)
pH (UA POC): 6 (ref 4.6–8.0)

## 2011-08-16 LAB — MICROSCOPIC EXAMINATION: WBC: >30 /hpf — AB (ref 0–5)

## 2011-08-16 MED ORDER — ROSUVASTATIN 40 MG TAB
40 mg | ORAL_TABLET | Freq: Every day | ORAL | Status: DC
Start: 2011-08-16 — End: 2012-12-29

## 2011-08-16 MED ORDER — CIPROFLOXACIN 500 MG TAB
500 mg | ORAL_TABLET | Freq: Two times a day (BID) | ORAL | Status: AC
Start: 2011-08-16 — End: 2011-08-21

## 2011-08-16 NOTE — Progress Notes (Signed)
HISTORY OF PRESENT ILLNESS  Casey Cowan is a 60 y.o. female.  HPI    Patient complains of dysuria, frequency, abnormal smelling urine.  Onset was 4 days ago, gradually worsening since that time. Patient denies back pain and fever. There is not any concern of sexual abuse. There is not a history of trauma to the genital area. Patient does not have a history of recurrent UTI.  Patient does not have a history of pyelonephritis.      Current Outpatient Prescriptions   Medication Sig Dispense Refill   ??? ezetimibe (ZETIA) 10 mg tablet Take 1 Tab by mouth daily.  30 Tab  5   ??? CRESTOR 40 mg tablet TAKE ONE TABLET BY MOUTH EVERY DAY  30 Tab  1   ??? cyanocobalamin (NASCOBAL) 500 mcg Spry 1 Squirt by Nasal route every seven (7) days. 1 squirt in 1 nostril.  1 Bottle  5   ??? ergocalciferol (VITAMIN D2) 50,000 unit capsule Take 1 Cap by mouth every seven (7) days.  12 Cap  1   ??? aspirin 81 mg tablet Take 81 mg by mouth daily.         ??? multivitamin (ONE A DAY) tablet Take 1 Tab by mouth daily.         ??? CALCIUM CARBONATE/VITAMIN D3 (CALTRATE 600 + D PO) Take  by mouth.           Allergies   Allergen Reactions   ??? Iodinated Contrast Media - Iv Dye Hives and Other (comments)     fever   ??? Pcn (Penicillins) Other (comments)     Lethargic and chills   ??? Zocor (Simvastatin) Myalgia           Review of Systems   Cardiovascular:        She is tolerating the Zetia.       Physical Exam   Cardiovascular: Normal rate, regular rhythm and normal heart sounds.    Pulmonary/Chest: Effort normal and breath sounds normal. No respiratory distress. She exhibits no tenderness.   Abdominal: Soft. There is Tenderness: Over the suprapubic area..     BP 126/80   Pulse 72   Temp(Src) 97.7 ??F (36.5 ??C) (Oral)   Ht 5\' 4"  (1.626 m)   Wt 244 lb (110.678 kg)   BMI 41.88 kg/m2    Urinalysis from 12/6 reviewed Showing greater than 30 white cells per high-power field.  Culture pending.  ASSESSMENT and PLAN   1. Acute cystitis  AMB POC URINALYSIS DIP STICK AUTO W/O MICRO, ciprofloxacin (CIPRO) 500 mg tablet Twice daily for 5 days.  Review culture.   2. Hypercholesteremia  Filled rosuvastatin (CRESTOR) 40 mg tablet, LIPID PANEL, HEPATIC FUNCTION PANEL will be checked in 2 months.

## 2011-08-19 LAB — CULTURE, URINE

## 2011-08-19 NOTE — Telephone Encounter (Signed)
Quick Note:    The Cipro will cover this infection  ______

## 2011-09-13 NOTE — Telephone Encounter (Signed)
Dr Sharl Ma or Wyona Almas - gave patient name and number

## 2011-09-13 NOTE — Telephone Encounter (Signed)
Pt wants you to refer her to an ENT she wants to be seen today can you call pt at [phone]

## 2011-10-25 ENCOUNTER — Encounter

## 2011-11-06 MED ORDER — TRAMADOL 50 MG TAB
50 mg | ORAL_TABLET | Freq: Three times a day (TID) | ORAL | Status: DC | PRN
Start: 2011-11-06 — End: 2012-03-25

## 2011-11-06 MED ORDER — ESOMEPRAZOLE MAGNESIUM 40 MG CAP, DELAYED RELEASE
40 mg | ORAL_CAPSULE | Freq: Every day | ORAL | Status: DC
Start: 2011-11-06 — End: 2012-03-25

## 2011-11-06 NOTE — Progress Notes (Signed)
HISTORY OF PRESENT ILLNESS  Casey Cowan is a 61 y.o. female.  HPI  She reports a fluttering sensation in her left chest followed by a cough going on for the last few weeks.  It was triggered by food intake and a feeling the food would not pass.  Sitting in certain positions will bring it on.   Not related to activity. She has the same symptoms at night at times and is more comfortable on her left or on her back.     Current Outpatient Prescriptions   Medication Sig Dispense Refill   ??? hydrocortisone-acetic acid (ACETASOL HC) otic solution by Otic route two (2) times a day.       ??? mometasone (NASONEX) 50 mcg/actuation nasal spray 2 Sprays daily.       ??? rosuvastatin (CRESTOR) 40 mg tablet Take 1 Tab by mouth daily.  30 Tab  11   ??? ezetimibe (ZETIA) 10 mg tablet Take 1 Tab by mouth daily.  30 Tab  5   ??? cyanocobalamin (NASCOBAL) 500 mcg Spry 1 Squirt by Nasal route every seven (7) days. 1 squirt in 1 nostril.  1 Bottle  5   ??? ergocalciferol (VITAMIN D2) 50,000 unit capsule Take 1 Cap by mouth every seven (7) days.  12 Cap  1   ??? aspirin 81 mg tablet Take 81 mg by mouth daily.         ??? multivitamin (ONE A DAY) tablet Take 1 Tab by mouth daily.         ??? CALCIUM CARBONATE/VITAMIN D3 (CALTRATE 600 + D PO) Take  by mouth.           Allergies   Allergen Reactions   ??? Iodinated Contrast Media - Iv Dye Hives and Other (comments)     fever   ??? Pcn (Penicillins) Other (comments)     Lethargic and chills   ??? Zocor (Simvastatin) Myalgia           Review of Systems   Constitutional: Negative for weight loss.   Gastrointestinal: Negative for heartburn, nausea, vomiting, abdominal pain, diarrhea, constipation and blood in stool.   Musculoskeletal: Joint pain: takes NSAID's 2-3 times a week with food; elevated liver enzymes in the past when using Tylenol for pain.       Physical Exam   Constitutional: She appears well-developed and well-nourished.   Neck: No JVD present.   Cardiovascular: Normal rate, regular rhythm, normal  heart sounds and intact distal pulses.  Exam reveals no gallop and no friction rub.    No murmur heard.  Pulmonary/Chest: Effort normal and breath sounds normal. She has no wheezes. She has no rales.   Abdominal: Soft. Bowel sounds are normal. There is Tenderness: minimal epigastric..   BP 120/82   Pulse 78   Ht 5\' 4"  (1.626 m)   Wt 246 lb (111.585 kg)   BMI 42.23 kg/m2    ECG: normal but with some change in axis from 2005    ASSESSMENT and PLAN  1. Hypercholesteremia  On Zetia for a couple of months; HEPATIC FUNCTION PANEL, LIPID PANEL   2. Palpitations  Suspect GI in origin, either reflux or NSAID induced; AMB POC EKG ROUTINE W/ 12 LEADS, INTER & REP, esomeprazole (NEXIUM) 40 mg capsule trial; referral if no better   3. Osteoarthritis  traMADol (ULTRAM) 50 mg tablet for pain and discontinue NSAID's

## 2011-11-06 NOTE — Patient Instructions (Signed)
MyChart Activation    Thank you for requesting access to MyChart. Please follow the instructions below to securely access and download your online medical record. MyChart allows you to send messages to your doctor, view your test results, renew your prescriptions, schedule appointments, and more.    How Do I Sign Up?    1. In your internet browser, go to [email or web address].  2. Click on the First Time User? Click Here link in the Sign In box. You will see the New Member Sign Up page.  3. Enter your MyChart Access Code exactly as it appears below. You will not need to use this code after you???ve completed the sign-up process. If you do not sign up before the expiration date, you must request a new code.    MyChart Access Code: [email or web address]  Expires: 02/04/2012 11:42 AM (This is the date your MyChart access code will expire)    4. Enter the last four digits of your Social Security Number (xxxx) and Date of Birth (mm/dd/yyyy) as indicated and click Submit. You will be taken to the next sign-up page.  5. Create a MyChart ID. This will be your MyChart login ID and cannot be changed, so think of one that is secure and easy to remember.  6. Create a MyChart password. You can change your password at any time.  7. Enter your Password Reset Question and Answer. This can be used at a later time if you forget your password.   8. Enter your e-mail address. You will receive e-mail notification when new information is available in MyChart.  9. Click Sign Up. You can now view and download portions of your medical record.  10. Click the Download Summary menu link to download a portable copy of your medical information.    Additional Information    If you have questions, please visit the Frequently Asked Questions section of the MyChart website at [email or web address] Remember, MyChart is NOT to be used for urgent needs. For medical emergencies, dial 911.

## 2011-11-07 LAB — LIPID PANEL
Cholesterol, total: 296 mg/dL — ABNORMAL HIGH (ref 100–199)
HDL Cholesterol: 83 mg/dL (ref >39)
LDL, calculated: 202 mg/dL — ABNORMAL HIGH (ref 0–99)
Triglyceride: 55 mg/dL (ref 0–149)
VLDL, calculated: 11 mg/dL (ref 5–40)

## 2011-11-07 LAB — HEPATIC FUNCTION PANEL
ALT (SGPT): 26 IU/L (ref 0–32)
AST (SGOT): 26 IU/L (ref 0–40)
Albumin: 4 g/dL (ref 3.6–4.8)
Alk. phosphatase: 73 IU/L (ref 25–165)
Bilirubin, direct: 0.13 mg/dL (ref 0.00–0.40)
Bilirubin, total: 0.5 mg/dL (ref 0.0–1.2)
Protein, total: 6.4 g/dL (ref 6.0–8.5)

## 2011-11-07 NOTE — Progress Notes (Signed)
Quick Note:    The liver tests are perfect but the Zetia made no difference. You can quit taking it. I will try to find [place] someone who is an expert in this area to give [place] an opinion on what to do next.  ______

## 2011-11-15 NOTE — Telephone Encounter (Signed)
[facility] [phone] for the mole.  I am not sure of reason for other symptoms but am happy to see her to evaluate them.

## 2011-11-15 NOTE — Telephone Encounter (Signed)
Casey Cowan, 27-Aug-1951, phone [phone]  Pt is requesting a recommendation on a Specialist for her to see regarding the mole on her left shoulder. She's had the mole since early puberty, it starting off like a beauty mark, but now it has a crust top to it, and it changes in size. There's pain on her left arm and it's slightly swollen, and her left ring finger is swollen as well.  She feels that due to her age a lot of changes has occurred with her breasts as well, they've become much larger.

## 2011-11-15 NOTE — Telephone Encounter (Signed)
Called pt back and left name and number of who to see for issue with mole per jpr

## 2012-03-25 MED ORDER — GABAPENTIN 100 MG CAP
100 mg | ORAL_CAPSULE | ORAL | Status: DC
Start: 2012-03-25 — End: 2012-11-20

## 2012-03-25 NOTE — Progress Notes (Signed)
Patient here to establish care.

## 2012-03-25 NOTE — Progress Notes (Signed)
Casey Cowan is a 61 y.o. female and presents with Establish Care, Joint Pain and Fatigue  .      Subjective:  Cardiovascular Review:  The patient has hyperlipidemia.  Diet and Lifestyle: generally follows a low fat low cholesterol diet  Home BP Monitoring: is not measured at home.  Pertinent ROS: taking medications as instructed, no medication side effects noted, no TIA's, no chest pain on exertion, no dyspnea on exertion, no swelling of ankles.     Hot flashes, low libido: not depressed     Menopause started at 61yo    Review of Systems  Constitutional: negative for fevers, chills, anorexia and weight loss  Eyes:   negative for visual disturbance and irritation  ENT:   negative for tinnitus,sore throat,nasal congestion,ear pains.hoarseness  Respiratory:  negative for cough, hemoptysis, dyspnea,wheezing  CV:   negative for chest pain, palpitations, lower extremity edema  GI:   negative for nausea, vomiting, diarrhea, abdominal pain,melena  Endo:               negative for polyuria,polydipsia,polyphagia,heat intolerance  Genitourinary: negative for frequency, dysuria and hematuria  Integument:  negative for rash and pruritus  Hematologic:  negative for easy bruising and gum/nose bleeding  Musculoskel: negative for myalgias, arthralgias, back pain, muscle weakness, joint pain  Neurological:  negative for headaches, dizziness, vertigo, memory problems and gait   Behavl/Psych: negative for feelings of anxiety, depression, mood changes    Past Medical History   Diagnosis Date   ??? Morbid obesity    ??? Lupus      in remission   ??? Mitral valve prolapse    ??? Anemia NEC      b12 defiency   ??? Hypercholesterolemia    ??? H/O: pneumonia 1970's, 1999   ??? Asthma      Past Surgical History   Procedure Date   ??? Hx hysterectomy 1998   ??? Hx cholecystectomy 1983   ??? Hx myomectomy 1990   ??? Hx cesarean section 1991   ??? 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 Hx lysis of adhesions 03/08/02   ??? Endoscopy, colon, diagnostic 2003     neg.    ??? Hx breast biopsy      negative     History     Social History   ??? Marital Status: MARRIED     Spouse Name: N/A     Number of Children: N/A   ??? Years of Education: N/A     Social History Main Topics   ??? Smoking status: Former Smoker   ??? Smokeless tobacco: Never Used   ??? Alcohol Use: No   ??? Drug Use: No   ??? Sexually Active: Not on file     Other Topics Concern   ??? 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 Stroke Mother    ??? Hypertension Father    ??? COPD Father    ??? Heart Attack Father      (1st @ 10)   ??? Alcohol abuse Brother      Current Outpatient Prescriptions   Medication Sig Dispense Refill   ??? VITAMIN B COMPLEX NO.12-NIACIN PO Take  by mouth.       ??? hydrocortisone-acetic acid (VOSOL-HC) otic solution by Otic  route two (2) times a day.       ??? mometasone (NASONEX) 50 mcg/actuation nasal spray 2 Sprays daily.       ??? rosuvastatin (CRESTOR) 40 mg tablet Take 1 Tab by mouth daily.  30 Tab  11   ??? ezetimibe (ZETIA) 10 mg tablet Take 1 Tab by mouth daily.  30 Tab  5   ??? ergocalciferol (VITAMIN D2) 50,000 unit capsule Take 1 Cap by mouth every seven (7) days.  12 Cap  1   ??? multivitamin (ONE A DAY) tablet Take 1 Tab by mouth daily.         ??? CALCIUM CARBONATE/VITAMIN D3 (CALTRATE 600 + D PO) Take  by mouth.         ??? cyanocobalamin (NASCOBAL) 500 mcg Spry 1 Squirt by Nasal route every seven (7) days. 1 squirt in 1 nostril.  1 Bottle  5     Allergies   Allergen Reactions   ??? Iodinated Contrast Media - Iv Dye Hives and Other (comments)     fever   ??? Pcn (Penicillins) Other (comments)     Lethargic and chills   ??? Zocor (Simvastatin) Myalgia       Objective:  BP 122/72   Pulse 65   Temp(Src) 98 ??F (36.7 ??C) (Oral)   Resp 20   Ht 5\' 4"  (1.626 m)   Wt 248 lb 6.4 oz (112.674 kg)   BMI 42.64 kg/m2   SpO2 95%  Physical  Exam:   General appearance - alert, well appearing, and in no distress  Mental status - alert, oriented to person, place, and time  EYE-PERLA, EOMI, fundi normal, corneas normal, no foreign bodies, visual acuity normal both eyes, no periorbital cellulitis  ENT-ENT exam normal, no neck nodes or sinus tenderness  Nose - normal and patent, no erythema, discharge or polyps  Mouth - mucous membranes moist, pharynx normal without lesions  Neck - supple, no significant adenopathy   Chest - clear to auscultation, no wheezes, rales or rhonchi, symmetric air entry   Heart - normal rate, regular rhythm, normal S1, S2, no murmurs, rubs, clicks or gallops   Abdomen - soft, nontender, nondistended, no masses or organomegaly  Lymph- no adenopathy palpable  Ext-peripheral pulses normal, no pedal edema, no clubbing or cyanosis  Skin-Warm and dry. no hyperpigmentation, vitiligo, or suspicious lesions  Neuro -alert, oriented, normal speech, no focal findings or movement disorder noted  Neck-normal C-spine, no tenderness, full ROM without pain      Results for orders placed in visit on 11/06/11   HEPATIC FUNCTION PANEL       Component Value Range    Protein, total 6.4  6.0 - 8.5 g/dL    Albumin 4.0  3.6 - 4.8 g/dL    Bilirubin, total 0.5  0.0 - 1.2 mg/dL    Bilirubin, direct 2.13  0.00 - 0.40 mg/dL    Alk. phosphatase 73  25 - 165 IU/L    AST 26  0 - 40 IU/L    ALT 26  0 - 32 IU/L   LIPID PANEL       Component Value Range    Cholesterol, total 296 (*) 100 - 199 mg/dL    Triglyceride 55  0 - 149 mg/dL    HDL Cholesterol 83  >39 mg/dL    VLDL, calculated 11  5 - 40 mg/dL    LDL, calculated 086 (*) 0 - 99 mg/dL     Prevention  Cardiovascular profile  Starting exercise at [employer]  Cholesterol goal ldl <130  Compliance with medication, diet and exercise:      Cancer risk profile  Mammogram [facility]  Lung not a smokeer  Colonoscopy 2003, will reschedule no prior pathology  Skin nonhealing in 2 weeks  Gyn abnormal  bleeding/discharge/abd pain/pressure, no vaginal pain /pressure; Dr. Orson Ape      Thyroid sx    Osteopenia prevention gastic bypass  Calcium 1000mg /day yes  Vitamin D 800iu/day yes    Mental health scale: 6/10, little bit irritible,no outbursts no crying spells  Sleeping 5 hours-6hrs/night will try gabapentin  Depression  Anxiety  Sleep # of hours:  Energy Level:        Immunizations  TDAP  Pneumonia vaccine  Flu vaccine  Shingles vaccine  HPV            Assessment/Plan:  1. Hypercholesterolemia   Educated pt re: decreasing CV risk  She is s/p gastric bypass and no longer taking aspirin  Cont medication  Exercise in group exercise     METABOLIC PANEL, COMPREHENSIVE, LIPID PANEL   2. Routine general medical examination at a health care facility     Prevention as noted above    3. Vitamin d deficiency   Sp gastric bypass  Assess levels   VITAMIN D, 25 HYDROXY   4. Anemia, B12 deficiency   Check levels     VITAMIN B12 & FOLATE   5. Glucose intolerance (impaired glucose tolerance)   Also metabolic syndrome type profile  Check bs   HEMOGLOBIN A1C   6. Fatigue   Underlying etiolry chck labs  Assess sleep     TSH, 3RD GENERATION, CBC W/O DIFF, VITAMIN D, 25 HYDROXY   7. Hot flashes   Trial neurontin      Follow up:  Labs  neurontin  Exercise regimen and compliance  Chol medication gabapentin (NEURONTIN) 100 mg capsule     Orders Placed This Encounter   ??? VITAMIN B COMPLEX NO.12-NIACIN PO     Sig: Take  by mouth.   ??? hydrocortisone-acetic acid (VOSOL-HC) otic solution     Sig: by Otic route two (2) times a day.     lose weight, increase physical activity, bring BP log to office visit, follow low fat diet, follow low salt diet, dash diet  There are no Patient Instructions on file for this visit.   Follow-up Disposition: Not on File

## 2012-03-25 NOTE — Patient Instructions (Signed)
Well Visit, Women 50 to 65: After Your Visit  Your Care Instructions  Physical exams can help you stay healthy. Your doctor has checked your overall health and may have suggested ways to take good care of yourself. He or she also may have recommended tests. At home, you can help prevent illness with healthy eating, regular exercise, and other steps.  Follow-up care is a key part of your treatment and safety. Be sure to make and go to all appointments, and call your doctor if you are having problems. It's also a good idea to know your test results and keep a list of the medicines you take.  How can you care for yourself at home?  ?? Reach and stay at a healthy weight. This will lower your risk for many problems, such as obesity, diabetes, heart disease, and high blood pressure.   ?? Get at least 30 minutes of exercise on most days of the week. Walking is a good choice. You also may want to do other activities, such as running, swimming, cycling, or playing tennis or team sports.   ?? Do not smoke. Smoking can make health problems worse. If you need help quitting, talk to your doctor about stop-smoking programs and medicines. These can increase your chances of quitting for good.   ?? Always wear sunscreen on exposed skin. Make sure the sunscreen blocks ultraviolet rays (both UVA and UVB) and has a sun protection factor (SPF) of at least 15. Use it every day, even when it is cloudy. Some doctors may recommend a higher SPF, such as 30.   ?? See a dentist one or two times a year for checkups and to have your teeth cleaned.   ?? Wear a seat belt in the car.   ?? Limit alcohol to 1 drink a day. Too much alcohol can cause health problems.   Follow your doctor's advice about when to have certain tests. These tests can spot problems early.  ?? Cholesterol. Your doctor will tell you how often to have this done based on your age, family history, or other things that can increase your risk for heart disease.   ?? Blood pressure.  Experts suggest that healthy adults with normal blood pressure (119/79 mm Hg or below) have their blood pressure checked at least every 1 to 2 years. This can be done during a routine doctor visit. If you have slightly higher or high blood pressure, or are at risk for heart disease, your doctor will suggest more frequent tests.   ?? Breast exam and mammogram. Ask your doctor how often you should have a clinical breast exam. Have a mammogram every one to two years. A mammogram can spot breast cancer before it can be felt and when it is easiest to treat.   ?? Pap test and pelvic exam. Ask your doctor how often you should have a Pap test. You may not need to have a Pap test as often as you used to.   ?? Vision. Have your eyes checked every year or two or as often as your doctor suggests. Some experts recommend that you have yearly exams for glaucoma and other age-related eye problems starting at age 50.   ?? Hearing. Tell your doctor if you notice any change in your hearing. You can have tests to find out how well you hear.   ?? Diabetes. Ask your doctor whether you should have tests for diabetes.   ?? Colon cancer. You should begin tests for colon   cancer at age 56. You may have one of several tests. Your doctor will tell you how often to have tests based on your age and risk. Risks include whether you already had a precancerous polyp removed from your colon or whether your parents, sisters and brothers, or children have had colon cancer.   ?? Thyroid disease. Talk to your doctor about whether to have your thyroid checked as part of a regular physical exam. Women have an increased chance of a thyroid problem.   ?? Osteoporosis. You should begin tests for bone density at age 37. If you are younger than 44, ask your doctor whether you have factors that may increase your risk for this disease. You may want to have this test before age 75.   ?? Coronary artery disease. Every 5 years, you should have your risks for heart disease  assessed. This test uses factors such as your age, blood pressure, cholesterol, and whether you smoke or have diabetes to show what your risk for a heart attack is over the next 10 years.   When should you call for help?  Watch closely for changes in your health, and be sure to contact your doctor if you have any problems or symptoms that concern you.    Where can you learn more?    Go to [facility]   Enter [phone] in the search box to learn more about "Well Visit, Women 50 to 65: After Your Visit."    ?? 2006-2013 [employer]. Care instructions adapted under license by [employer] (which disclaims liability or warranty for this information). This care instruction is for use with your licensed healthcare professional. If you have questions about a medical condition or this instruction, always ask your healthcare professional. Healthwise, Incorporated disclaims any warranty or liability for your use of this information.  Content Version: 9.7.130178; Last Revised: April 04, 2010          Preventing Osteoporosis: After Your Visit  Your Care Instructions  Osteoporosis means the bones are weak and thin enough that they can break easily. The older you are, the more likely you are to get osteoporosis. But with plenty of calcium, vitamin D, and exercise, you can help prevent osteoporosis.  The preteen and teen years are a key time for bone building. With the help of calcium, vitamin D, and exercise in those early years and beyond, the bones reach their peak density and strength by age 24. After age 61, your bones naturally start to thin and weaken.  The stronger your bones are at around age 67, the lower your risk for osteoporosis. But no matter what your age and risk are, your bones still need calcium, vitamin D, and exercise to stay strong. Also avoid smoking, and limit alcohol. Smoking and heavy alcohol use can make your bones thinner.  Talk to your doctor about any special risks you  might have, such as having a close relative with osteoporosis or taking a medicine that can weaken bones. Your doctor can tell you the best ways to protect your bones from thinning.  Follow-up care is a key part of your treatment and safety. Be sure to make and go to all appointments, and call your doctor if you are having problems. It's also a good idea to know your test results and keep a list of the medicines you take.  How can you care for yourself at home?  ?? Get enough calcium and vitamin D. The Institute of Medicine recommends adults younger  than age 13 need 1,000 mg of calcium and 600 IU of vitamin D each day. Women ages 58 to 2 need 1,200 mg of calcium and 600 IU of vitamin D each day. Men ages 74 to 30 need 1,000 mg of calcium and 600 IU of vitamin D each day. Adults 71 and older need 1,200 mg of calcium and 800 IU of vitamin D each day.   ?? Eat foods rich in calcium, like yogurt, cheese, milk, and dark green vegetables.   ?? Eat foods rich in vitamin D, like eggs, fatty fish, cereal, and fortified milk.   ?? Get some sunshine. Your body uses sunshine to make its own vitamin D. The safest time to be out in the sun is before 10 a.m. or after 3 p.m. Avoid getting sunburned. Sunburn can increase your risk of skin cancer.   ?? Talk to your doctor about taking a calcium plus vitamin D supplement. Ask about what type of calcium is right for you, and how much to take at a time. Adults ages 1 to 81 should not get more than 2,500 mg of calcium and 4,000 IU of vitamin D each day, whether it is from supplements and/or food. Adults ages 62 and older should not get more than 2,000 mg of calcium and 4,000 IU of vitamin D each day from supplements and/or food.   ?? Get regular bone-building exercise. Weight-bearing and resistance exercises keep bones healthy by working the muscles and bones against gravity. Start out at an exercise level that feels right for you. Add a little at a time until you can do the following:   ??  Do 30 minutes of weight-bearing exercise on most days of the week. Walking, jogging, stair climbing, and dancing are good choices.   ?? Do resistance exercises with weights or elastic bands 2 to 3 days a week.   ?? Limit alcohol. Drink no more than 1 alcohol drink a day if you are a woman. Drink no more than 2 alcohol drinks a day if you are a man.   ?? Do not smoke. Smoking can make bones thin faster. If you need help quitting, talk to your doctor about stop-smoking programs and medicines. These can increase your chances of quitting for good.   When should you call for help?  Watch closely for changes in your health, and be sure to contact your doctor if:  ?? You need help with a healthy eating plan.   ?? You need help with an exercise plan.     Where can you learn more?    Go to [facility]   Enter [phone] in the search box to learn more about "Preventing Osteoporosis: After Your Visit."    ?? 2006-2013 Healthwise, [employer]. Care instructions adapted under license by [employer] (which disclaims liability or warranty for this information). This care instruction is for use with your licensed healthcare professional. If you have questions about a medical condition or this instruction, always ask your healthcare professional. [employer], [employer] disclaims any warranty or liability for your use of this information.  Content Version: 9.7.130178; Last Revised: February 26, 2010          DASH Diet: After Your Visit  Your Care Instructions  The DASH diet is an eating plan that can help lower your blood pressure. DASH stands for Dietary Approaches to Stop Hypertension. Hypertension is high blood pressure.  The DASH diet focuses on eating foods that are high in calcium, potassium, and magnesium. These  nutrients can lower blood pressure. The foods that are highest in these nutrients are fruits, vegetables, low-fat dairy products, nuts, seeds, and legumes. But taking calcium, potassium, and magnesium  supplements instead of eating foods that are high in those nutrients does not have the same effect. The DASH diet also includes whole grains, fish, and poultry.  The DASH diet is one of several lifestyle changes your doctor may recommend to lower your high blood pressure. Your doctor may also want you to decrease the amount of sodium in your diet. Lowering sodium while following the DASH diet can lower blood pressure even further than just the DASH diet alone.  Follow-up care is a key part of your treatment and safety. Be sure to make and go to all appointments, and call your doctor if you are having problems. It???s also a good idea to know your test results and keep a list of the medicines you take.  How can you care for yourself at home?  Following the DASH diet  ?? Eat 4 to 5 servings of fruit each day. A serving is 1 medium-sized piece of fruit, ?? cup chopped or canned fruit, 1/4 cup dried fruit, or 4 ounces (?? cup) of fruit juice. Choose fruit more often than fruit juice.   ?? Eat 4 to 5 servings of vegetables each day. A serving is 1 cup of lettuce or raw leafy vegetables, ?? cup of chopped or cooked vegetables, or 4 ounces (?? cup) of vegetable juice. Choose vegetables more often than vegetable juice.   ?? Get 2 to 3 servings of low-fat and fat-free dairy each day. A serving is 8 ounces of milk, 1 cup of yogurt, or 1 ?? ounces of cheese.   ?? Eat 7 to 8 servings of grains each day. A serving is 1 slice of bread, 1 ounce of dry cereal, or ?? cup of cooked rice, pasta, or cooked cereal. Try to choose whole-grain products as much as possible.   ?? Limit lean meat, poultry, and fish to 6 ounces each day. Six ounces is about the size of two decks of cards.   ?? Eat 4 to 5 servings of nuts, seeds, and legumes (cooked dried beans, lentils, and split peas) each week. A serving is 1/3 cup of nuts, 2 tablespoons of seeds, or ?? cup cooked dried beans or peas.   ?? Limit sweets and added sugars to 5 servings or less a week. A  serving is 1 tablespoon jelly or jam, ?? cup sorbet, or 1 cup of lemonade.   Tips for success  ?? Start small. Do not try to make dramatic changes to your diet all at once. You might feel that you are missing out on your favorite foods and then be more likely to not follow the plan. Make small changes, and stick with them. Once those changes become habit, add a few more changes.   ?? Try some of the following:   ?? Make it a goal to eat a fruit or vegetable at every meal and at snacks. This will make it easy to get the recommended amount of fruits and vegetables each day.   ?? Try yogurt topped with fruit and nuts for a snack or healthy dessert.   ?? Add lettuce, tomato, cucumber, and onion to sandwiches.   ?? Combine a ready-made pizza crust with low-fat mozzarella cheese and lots of vegetable toppings. Try using tomatoes, squash, spinach, broccoli, carrots, cauliflower, and onions.   ?? Have a variety  of cut-up vegetables with a low-fat dip as an appetizer instead of chips and dip.   ?? Sprinkle sunflower seeds or chopped almonds over salads. Or try adding chopped walnuts or almonds to cooked vegetables.   ?? Try some vegetarian meals using beans and peas. Add garbanzo or kidney beans to salads. Make burritos and tacos with mashed pinto beans or black beans.     Where can you learn more?    Go to [facility]   Enter H967 in the search box to learn more about "DASH Diet: After Your Visit."    ?? 2006-2013 Healthwise, Inc[employer]. Care instructions adapted under license by [employer] (which disclaims liability or warranty for this information). This care instruction is for use with your licensed healthcare professional. If you have questions about a medical condition or this instruction, always ask your healthcare professional. Healthwise, Inc[employer] disclaims any warranty or liability for your use of this information.  Content Version: 9.7.130178; Last Revised: November 22, 2010

## 2012-03-26 LAB — METABOLIC PANEL, COMPREHENSIVE
A-G Ratio: 1.5 (ref 1.1–2.5)
ALT (SGPT): 16 IU/L (ref 0–32)
AST (SGOT): 19 IU/L (ref 0–40)
Albumin: 3.9 g/dL (ref 3.6–4.8)
Alk. phosphatase: 81 IU/L (ref 47–112)
BUN/Creatinine ratio: 19 (ref 11–26)
BUN: 10 mg/dL (ref 8–27)
Bilirubin, total: 0.4 mg/dL (ref 0.0–1.2)
CO2: 25 mmol/L (ref 19–28)
Calcium: 9.2 mg/dL (ref 8.6–10.2)
Chloride: 104 mmol/L (ref 97–108)
Creatinine: 0.54 mg/dL — ABNORMAL LOW (ref 0.57–1.00)
GFR est non-AA: 102 mL/min/{1.73_m2} (ref >59)
GLOBULIN, TOTAL: 2.6 g/dL (ref 1.5–4.5)
Glucose: 91 mg/dL (ref 65–99)
Potassium: 4.2 mmol/L (ref 3.5–5.2)
Protein, total: 6.5 g/dL (ref 6.0–8.5)
Sodium: 143 mmol/L (ref 134–144)
eGFR If African American: 118 mL/min/{1.73_m2} (ref >59)

## 2012-03-26 LAB — CBC W/O DIFF
HCT: 37.5 % (ref 34.0–46.6)
HGB: 11.8 g/dL (ref 11.1–15.9)
MCH: 25.8 pg — ABNORMAL LOW (ref 26.6–33.0)
MCHC: 31.5 g/dL (ref 31.5–35.7)
MCV: 82 fL (ref 79–97)
PLATELET: 242 10*3/uL (ref 140–415)
RBC: 4.58 x10E6/uL (ref 3.77–5.28)
RDW: 16 % — ABNORMAL HIGH (ref 12.3–15.4)
WBC: 5.5 10*3/uL (ref 4.0–10.5)

## 2012-03-26 LAB — TSH 3RD GENERATION: TSH: 1.6 u[IU]/mL (ref 0.450–4.500)

## 2012-03-26 LAB — LIPID PANEL
Cholesterol, total: 364 mg/dL — ABNORMAL HIGH (ref 100–199)
HDL Cholesterol: 87 mg/dL (ref >39)
LDL, calculated: 261 mg/dL — ABNORMAL HIGH (ref 0–99)
Triglyceride: 82 mg/dL (ref 0–149)
VLDL, calculated: 16 mg/dL (ref 5–40)

## 2012-03-26 LAB — VITAMIN B12 & FOLATE
Folate: 14.6 ng/mL (ref >3.0)
Vitamin B12: 310 pg/mL (ref 211–946)

## 2012-03-26 LAB — HEMOGLOBIN A1C WITH EAG: Hemoglobin A1c: 6.3 % — ABNORMAL HIGH (ref 4.8–5.6)

## 2012-03-26 LAB — VITAMIN D, 25 HYDROXY: VITAMIN D, 25-HYDROXY: 15.9 ng/mL — ABNORMAL LOW (ref 30.0–100.0)

## 2012-03-31 MED ORDER — ERGOCALCIFEROL (VITAMIN D2) 50,000 UNIT CAP
1250 mcg (50,000 unit) | ORAL_CAPSULE | ORAL | Status: DC
Start: 2012-03-31 — End: 2013-03-02

## 2012-03-31 MED ORDER — CYANOCOBALAMIN (VIT B-12) 500 MCG/SPRAY NASAL SPRAY
500 mcg/spray | NASAL | Status: AC
Start: 2012-03-31 — End: ?

## 2012-03-31 NOTE — Progress Notes (Signed)
Chief Complaint   Patient presents with   ??? Surgical Follow-up     10 years post lap gastric bypass   ??? Abdominal Pain     Patient is 10 years status post gastric bypass.  Presents today with c/o some abdominal pain.  She started with vague crampy abdominal pain in the left abdomen over the weekend.  She had a low grade fever and general malaise.  No nausea or vomiting.  She had some constipation followed by loose stools.  No black or bloody stools.  Her daughter is with her today and says "oh yeah, I had that same thing last week".    She feels better today.  She is also c/o an issue with her right hand and finger "looking funny and hurting".    She has had recent labs with her PCP and is doing better with taking her vitamins.  She has stopped the Nascobal and thought she didn't need it any longer.  She has stopped working and her stress level is less. Her weight is fairly stable and she has lost a little.  She reports some urinary frequency and irritation.      BP 110/80   Ht 5\' 4"  (1.626 m)   Wt 245 lb 8 oz (111.358 kg)   BMI 42.14 kg/m2  A + O x 3, obese AA female here with daughter   Chest  CTA  COR  RRR  ABD Soft, obese; NT/ND, +BS, no masses or hernias.  EXT Trace LE edema; ambulating independently    Dr. Florene Route saw the patient about her finger complaints and performed an exam of both hands.      1. Other and unspecified postsurgical nonabsorption     2. Vitamin d deficiency  ergocalciferol (VITAMIN D2) 50,000 unit capsule   3. Vitamin B12 deficiency  cyanocobalamin (NASCOBAL) 500 mcg Spry   4. Urinary frequency  URINALYSIS W/ RFLX MICROSCOPIC   5. BMI 40.0-44.9, adult       Check UA  Recent abdominal pain probably viral illness that has resolved  May also have some irritation due to NSAID use and advised to stop   Labs reviewed and D increased to 2x/ week  Resume Nascobal and she was given a coupon   No NSAIDs and this was reviewed at length  Follow up in 3 months  Reviewed diet and behavior  If abdominal  pain recurs follow up and will reassess and may need EGD  Finger pain = arthritic changes and no surgical intervention at this time

## 2012-03-31 NOTE — Patient Instructions (Signed)
Avoid all anti-inflammatories (Aleve, Ibuprofen, Advil, Motrin, BC Powders, ect)    If having stomach upset, you may use Pepto Bismol, Mylanta, Gaviscon    For Bowels add fiber and may use senna tea as a gentle laxative

## 2012-04-01 LAB — URINALYSIS W/ RFLX MICROSCOPIC
Bilirubin: NEGATIVE
Blood: NEGATIVE
Glucose: NEGATIVE
Ketone: NEGATIVE
Leukocyte Esterase: NEGATIVE
Nitrites: NEGATIVE
Specific Gravity: >=1.03 — AB (ref 1.005–1.030)
Urobilinogen: 1 mg/dL (ref 0.0–1.9)
pH (UA): 7 (ref 5.0–7.5)

## 2012-04-07 MED ORDER — HYDROCORTISONE-ACETIC ACID 1 %-2 % EAR DROPS
1-2 % | OTIC | Status: AC
Start: 2012-04-07 — End: 2012-05-08

## 2012-04-07 NOTE — Telephone Encounter (Signed)
Patient needs a refill on her Hydrocortisone 1% acid otic sol. For her ears to call in [facility] at [phone]

## 2012-04-07 NOTE — Progress Notes (Signed)
Quick Note:    No UTI - please let patient know  ______

## 2012-04-08 NOTE — Telephone Encounter (Signed)
Spoke with patient regarding results per [employer] No UTI.

## 2012-04-08 NOTE — Addendum Note (Signed)
Addended by: Raylene Miyamoto on: 04/08/2012 09:49 AM     Modules accepted: Level of Service

## 2012-04-08 NOTE — Telephone Encounter (Signed)
Message copied by Melrose Nakayama on Wed Apr 08, 2012  2:37 PM  ------       Message from: Diamantina Monks       Created: Tue Apr 07, 2012  4:46 PM         No UTI - please let patient know

## 2012-05-18 NOTE — Telephone Encounter (Signed)
Spoke to pt, pt is requesting the result of her TSH result done on 03/25/12, read pt her result, pt very appreciative.

## 2012-05-18 NOTE — Telephone Encounter (Signed)
Patient wants to know if a thyroid panel has very been done? Her no is [phone]

## 2012-05-26 MED ORDER — CITALOPRAM 10 MG TAB
10 mg | ORAL_TABLET | Freq: Every day | ORAL | Status: AC
Start: 2012-05-26 — End: ?

## 2012-05-26 NOTE — Progress Notes (Signed)
Casey Cowan is a 61 y.o. female and presents with Cholesterol Problem and fatigue  .      Subjective:  Cardiovascular Review:  The patient has hyperlipidemia.  Diet and Lifestyle: generally follows a low fat low cholesterol diet  Home BP Monitoring: is not measured at home.  Pertinent ROS: taking medications as instructed, no medication side effects noted, no TIA's, no chest pain on exertion, no dyspnea on exertion, no swelling of ankles.  She is currently on a higher dose of Crestor, 40mg  no side effects with zetia    fatigue  She reports fatigue which seems to be progressive.   She reports she is deconditioned and gets tired going up to her second floor.  She denies fevers, weight loss, night sweats.  Sleep has been interupted and can not shut off ideas or problems from the day.  She reports her son is having marital problems, daughter just started college, takes care of husband and house.  She reports she also drinks coffee, 3 cups/day and does not exercise. Energy down for last 5 months, she has a 2 story house if she goes  Upstairs she stays up because so fatigued to go up again, slightly sob when goes up stairs  Energy level 2/10 baseline 6/10  Hours sleeping- not sleeping thru night, gets up to the bathroom, 10 hrs.  She had a sleep study that was negative for sleep apnea and also had an echo with Dr. Phylliss Bob, she also complains of decreased libido    Review of Systems  Constitutional: negative for fevers, chills, anorexia and weight loss  Eyes:   negative for visual disturbance and irritation  ENT:   negative for tinnitus,sore throat,nasal congestion,ear pains.hoarseness  Respiratory:  negative for cough, hemoptysis, dyspnea,wheezing  CV:   negative for chest pain, palpitations, lower extremity edema  GI:   negative for nausea, vomiting, diarrhea, abdominal pain,melena  Endo:               negative for polyuria,polydipsia,polyphagia,heat intolerance  Genitourinary: negative for frequency, dysuria and  hematuria  Integument:  negative for rash and pruritus  Hematologic:  negative for easy bruising and gum/nose bleeding  Musculoskel: negative for myalgias, arthralgias, back pain, muscle weakness, joint pain  Neurological:  negative for headaches, dizziness, vertigo, memory problems and gait   Behavl/Psych: negative for feelings of anxiety, depression, mood changes    Past Medical History   Diagnosis Date   ??? Morbid obesity    ??? Lupus      in remission   ??? Mitral valve prolapse    ??? Anemia NEC      b12 defiency   ??? Hypercholesterolemia    ??? H/O: pneumonia 1970's, 1999   ??? Asthma      Past Surgical History   Procedure Date   ??? Hx hysterectomy 1998   ??? Hx cholecystectomy 1983   ??? Hx myomectomy 1990   ??? Hx cesarean section 1991   ??? 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 Hx lysis of adhesions 03/08/02   ??? Endoscopy, colon, diagnostic 2003     neg.   ??? Hx breast biopsy      negative     History     Social History   ??? Marital Status: MARRIED     Spouse Name: N/A     Number of Children: N/A   ??? Years of Education: N/A     Social History Main Topics   ??? Smoking status: Former  Smoker   ??? Smokeless tobacco: Never Used   ??? Alcohol Use: No   ??? Drug Use: No   ??? Sexually Active: Not on file     Other Topics Concern   ??? 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 Stroke Mother    ??? Hypertension Father    ??? COPD Father    ??? Heart Attack Father      (1st @ 83)   ??? Alcohol abuse Brother      Current Outpatient Prescriptions   Medication Sig Dispense Refill   ??? citalopram (CELEXA) 10 mg tablet Take 1 Tab by mouth daily.  30 Tab  0   ??? ergocalciferol (VITAMIN D2) 50,000 unit capsule Take 1 Cap by mouth every Tuesday and Thursday.  24 Cap  1   ??? cyanocobalamin (NASCOBAL) 500 mcg Spry 1 Squirt by Nasal route every seven (7) days. 1  squirt in 1 nostril.  2.3 mL  6   ??? rosuvastatin (CRESTOR) 40 mg tablet Take 1 Tab by mouth daily.  30 Tab  11   ??? ezetimibe (ZETIA) 10 mg tablet Take 1 Tab by mouth daily.  30 Tab  5   ??? multivitamin (ONE A DAY) tablet Take 1 Tab by mouth daily.         ??? CALCIUM CARBONATE/VITAMIN D3 (CALTRATE 600 + D PO) Take  by mouth.         ??? naproxen sodium (ALEVE) 220 mg cap Take  by mouth as needed.       ??? VITAMIN B COMPLEX NO.12-NIACIN PO Take  by mouth.       ??? gabapentin (NEURONTIN) 100 mg capsule Take 1 po at night can increase to 3po at night  30 Cap  1   ??? mometasone (NASONEX) 50 mcg/actuation nasal spray 2 Sprays daily.         Allergies   Allergen Reactions   ??? Iodinated Contrast Media - Iv Dye Hives and Other (comments)     fever   ??? Pcn (Penicillins) Other (comments)     Lethargic and chills   ??? Zocor (Simvastatin) Myalgia       Objective:  BP 140/76   Pulse 57   Temp 98 ??F (36.7 ??C) (Oral)   Resp 16   Ht 5\' 4"  (1.626 m)   Wt 246 lb (111.585 kg)   BMI 42.23 kg/m2   SpO2 100%  Physical Exam:   General appearance - alert, well appearing, and in no distress  Mental status - alert, oriented to person, place, and time  EYE-PERLA, EOMI, fundi normal, corneas normal, no foreign bodies, visual acuity normal both eyes, no periorbital cellulitis  ENT-ENT exam normal, no neck nodes or sinus tenderness  Nose - normal and patent, no erythema, discharge or polyps  Mouth - mucous membranes moist, pharynx normal without lesions  Neck - supple, no significant adenopathy   Chest - clear to auscultation, no wheezes, rales or rhonchi, symmetric air entry   Heart - normal rate, regular rhythm, normal S1, S2, no murmurs, rubs, clicks or gallops   Abdomen - soft, nontender, nondistended, no masses or organomegaly  Lymph- no adenopathy palpable  Ext-peripheral pulses normal, no pedal edema, no clubbing or cyanosis  Skin-Warm and dry. no  hyperpigmentation, vitiligo, or suspicious lesions  Neuro -alert, oriented, normal speech, no focal  findings or movement disorder noted  Neck-normal C-spine, no tenderness, full ROM without pain      Results for orders placed in visit on 03/31/12   URINALYSIS W/ RFLX MICROSCOPIC       Component Value Range    Specific Gravity      >=1.030 (*) 1.005 - 1.030    pH 7.0  5.0 - 7.5    Color Yellow  Yellow    Appearance Clear  Clear    Leukocyte Esterase Negative  Negative    Protein Trace  Negative/Trace    Glucose Negative  Negative    Ketone Negative  Negative    Blood Negative  Negative    Bilirubin Negative  Negative    Urobilinogen 1.0  0.0 - 1.9 mg/dL    Nitrites Negative  Negative    Microscopic Examination Comment       Prevention follow up    Cardiovascular profile  Not as active with [employer]  High cv risk      Cancer risk profile-  Mammogram [facility]  Lung not a smokeer  Colonoscopy 2003, will reschedule no prior pathology  Skin nonhealing in 2 weeks  Gyn abnormal bleeding/discharge/abd pain/pressure, no vaginal pain /pressure; Dr. Orson Ape            Assessment/Plan:  1. Hypercholesterolemia   chekcing labs on higer dose of Crestor     METABOLIC PANEL, COMPREHENSIVE, LIPID PANEL   2. hammertoe Follow up with podiatry   3. Gastric bypass stable   4.     5. Glucose intolerance (impaired glucose tolerance)   Monitor high risk   HEMOGLOBIN A1C   6. Fatigue      Persistent sx.    Will try celexa , new rx, sed  I think she needs a repeat echo based on her CV hx she mya have some diastolic dysfunction       7. Hot flashes   Stable,  Query re: sx at next visit    Follow up in 1 month  Schedule her for echo if sx not resolving gabapentin (NEURONTIN) 100 mg capsule     Orders Placed This Encounter   ??? LIPID PANEL   ??? METABOLIC PANEL, COMPREHENSIVE   ??? citalopram (CELEXA) 10 mg tablet     Sig: Take 1 Tab by mouth daily.     Dispense:  30 Tab     Refill:  0     lose weight, increase physical activity, bring BP log to office visit, follow low fat diet, follow low salt diet, dash diet  Patient Instructions        Resistance Training With Free Weights: Exercises  Your Care Instructions  Here are some examples of exercises for resistance training. Start each exercise slowly. Ease off the exercise if you start to have pain.  Your doctor or physical therapist will tell you when you can start these exercises and which ones will work best for you.  How to do the exercises  Chest fly    1. Lie on a bench or exercise ball, and hold the weights straight up over your chest. Do not lock your elbows. You can keep them slightly bent if that is comfortable for you.   2. Slowly lower your arms, keeping them extended, until the weights are level with your chest, or slightly lower.   3. Slowly raise your arms until you are  in the starting position.   4. Repeat 8 to 12 times.   5. Rest for a minute, and repeat the exercise.   Lateral raise for the outer part of the shoulder (deltoid)    1. Stand with your feet shoulder-width apart and your knees slightly bent.   2. Bend your arms 90 degrees with your elbows at hip level. With your palms facing in, hold the weights straight in front of you.   3. Slowly lift the weights and your elbows out to the sides to shoulder level, keeping your elbows bent. Keep your shoulders down and relaxed as you lift. If you find you are shrugging your shoulders up toward your ears, your weights may be too heavy.   4. Slowly lower the weights back to your sides.   5. Repeat 8 to 12 times.   6. Rest for a minute, and repeat the exercise.   Biceps curls    1. Sit leaning forward with your legs slightly spread and your left hand on your left thigh.   2. Hold the weight in your right hand, and place your right elbow on your right thigh.   3. Slowly curl the weight up and toward your chest.   4. Slowly lower the weight to the original position.   5. Repeat 8 to 12 times.   6. Rest for a minute, and repeat the exercise.   7. Do the same exercise with your other arm.   Follow-up care is a key part of your treatment and  safety. Be sure to make and go to all appointments, and call your doctor if you are having problems. It's also a good idea to know your test results and keep a list of the medicines you take.    Where can you learn more?    Go to [email or web address]   Enter [phone] in the search box to learn more about "Resistance Training With Free Weights: Exercises."    ?? 2006-2013 Healthwise, Incorporated. Care instructions adapted under license by [employer] (which disclaims liability or warranty for this information). This care instruction is for use with your licensed healthcare professional. If you have questions about a medical condition or this instruction, always ask your healthcare professional. Healthwise, Incorporated disclaims any warranty or liability for your use of this information.  Content Version: 9.7.130178; Last Revised: July 13, 2010          DASH Diet: After Your Visit  Your Care Instructions  The DASH diet is an eating plan that can help lower your blood pressure. DASH stands for Dietary Approaches to Stop Hypertension. Hypertension is high blood pressure.  The DASH diet focuses on eating foods that are high in calcium, potassium, and magnesium. These nutrients can lower blood pressure. The foods that are highest in these nutrients are fruits, vegetables, low-fat dairy products, nuts, seeds, and legumes. But taking calcium, potassium, and magnesium supplements instead of eating foods that are high in those nutrients does not have the same effect. The DASH diet also includes whole grains, fish, and poultry.  The DASH diet is one of several lifestyle changes your doctor may recommend to lower your high blood pressure. Your doctor may also want you to decrease the amount of sodium in your diet. Lowering sodium while following the DASH diet can lower blood pressure even further than just the DASH diet alone.  Follow-up care is a key part of your treatment and safety. Be sure to make and go to  all  appointments, and call your doctor if you are having problems. It???s also a good idea to know your test results and keep a list of the medicines you take.  How can you care for yourself at home?  Following the DASH diet  ?? Eat 4 to 5 servings of fruit each day. A serving is 1 medium-sized piece of fruit, ?? cup chopped or canned fruit, 1/4 cup dried fruit, or 4 ounces (?? cup) of fruit juice. Choose fruit more often than fruit juice.   ?? Eat 4 to 5 servings of vegetables each day. A serving is 1 cup of lettuce or raw leafy vegetables, ?? cup of chopped or cooked vegetables, or 4 ounces (?? cup) of vegetable juice. Choose vegetables more often than vegetable juice.   ?? Get 2 to 3 servings of low-fat and fat-free dairy each day. A serving is 8 ounces of milk, 1 cup of yogurt, or 1 ?? ounces of cheese.   ?? Eat 7 to 8 servings of grains each day. A serving is 1 slice of bread, 1 ounce of dry cereal, or ?? cup of cooked rice, pasta, or cooked cereal. Try to choose whole-grain products as much as possible.   ?? Limit lean meat, poultry, and fish to 6 ounces each day. Six ounces is about the size of two decks of cards.   ?? Eat 4 to 5 servings of nuts, seeds, and legumes (cooked dried beans, lentils, and split peas) each week. A serving is 1/3 cup of nuts, 2 tablespoons of seeds, or ?? cup cooked dried beans or peas.   ?? Limit sweets and added sugars to 5 servings or less a week. A serving is 1 tablespoon jelly or jam, ?? cup sorbet, or 1 cup of lemonade.   Tips for success  ?? Start small. Do not try to make dramatic changes to your diet all at once. You might feel that you are missing out on your favorite foods and then be more likely to not follow the plan. Make small changes, and stick with them. Once those changes become habit, add a few more changes.   ?? Try some of the following:   ?? Make it a goal to eat a fruit or vegetable at every meal and at snacks. This will make it easy to get the recommended amount of fruits and  vegetables each day.   ?? Try yogurt topped with fruit and nuts for a snack or healthy dessert.   ?? Add lettuce, tomato, cucumber, and onion to sandwiches.   ?? Combine a ready-made pizza crust with low-fat mozzarella cheese and lots of vegetable toppings. Try using tomatoes, squash, spinach, broccoli, carrots, cauliflower, and onions.   ?? Have a variety of cut-up vegetables with a low-fat dip as an appetizer instead of chips and dip.   ?? Sprinkle sunflower seeds or chopped almonds over salads. Or try adding chopped walnuts or almonds to cooked vegetables.   ?? Try some vegetarian meals using beans and peas. Add garbanzo or kidney beans to salads. Make burritos and tacos with mashed pinto beans or black beans.     Where can you learn more?    Go to [facility]   Enter H967 in the search box to learn more about "DASH Diet: After Your Visit."    ?? 2006-2013 Healthwise, Incorporated. Care instructions adapted under license by [employer] (which disclaims liability or warranty for this information). This care instruction is for use with your licensed healthcare professional.  If you have questions about a medical condition or this instruction, always ask your healthcare professional. Healthwise, Incorporated disclaims any warranty or liability for your use of this information.  Content Version: 9.7.130178; Last Revised: November 22, 2010               Follow-up Disposition: Not on File

## 2012-05-26 NOTE — Telephone Encounter (Signed)
Spoke to pt, advised that she stop by office anytime to pick up sample medication.

## 2012-05-26 NOTE — Telephone Encounter (Signed)
Message copied by Hazeline Junker on Tue May 26, 2012  1:07 PM  ------       Message from: GAMBLE, Geoffry Paradise       Created: Tue May 26, 2012 12:13 PM       Regarding: Hilda Lias / Refill         [phone]              Pt requests samples of Crestor or Zetia.

## 2012-05-26 NOTE — Patient Instructions (Signed)
Resistance Training With Free Weights: Exercises  Your Care Instructions  Here are some examples of exercises for resistance training. Start each exercise slowly. Ease off the exercise if you start to have pain.  Your doctor or physical therapist will tell you when you can start these exercises and which ones will work best for you.  How to do the exercises  Chest fly    1. Lie on a bench or exercise ball, and hold the weights straight up over your chest. Do not lock your elbows. You can keep them slightly bent if that is comfortable for you.   2. Slowly lower your arms, keeping them extended, until the weights are level with your chest, or slightly lower.   3. Slowly raise your arms until you are in the starting position.   4. Repeat 8 to 12 times.   5. Rest for a minute, and repeat the exercise.   Lateral raise for the outer part of the shoulder (deltoid)    1. Stand with your feet shoulder-width apart and your knees slightly bent.   2. Bend your arms 90 degrees with your elbows at hip level. With your palms facing in, hold the weights straight in front of you.   3. Slowly lift the weights and your elbows out to the sides to shoulder level, keeping your elbows bent. Keep your shoulders down and relaxed as you lift. If you find you are shrugging your shoulders up toward your ears, your weights may be too heavy.   4. Slowly lower the weights back to your sides.   5. Repeat 8 to 12 times.   6. Rest for a minute, and repeat the exercise.   Biceps curls    1. Sit leaning forward with your legs slightly spread and your left hand on your left thigh.   2. Hold the weight in your right hand, and place your right elbow on your right thigh.   3. Slowly curl the weight up and toward your chest.   4. Slowly lower the weight to the original position.   5. Repeat 8 to 12 times.   6. Rest for a minute, and repeat the exercise.   7. Do the same exercise with your other arm.   Follow-up care is a key part of your treatment and  safety. Be sure to make and go to all appointments, and call your doctor if you are having problems. It's also a good idea to know your test results and keep a list of the medicines you take.    Where can you learn more?    Go to http://www.healthwise.net/[facility]   Enter U404 in the search box to learn more about "Resistance Training With Free Weights: Exercises."    ?? 2006-2013 Healthwise, Incorporated. Care instructions adapted under license by [employer] (which disclaims liability or warranty for this information). This care instruction is for use with your licensed healthcare professional. If you have questions about a medical condition or this instruction, always ask your healthcare professional. Healthwise, Incorporated disclaims any warranty or liability for your use of this information.  Content Version: 9.7.130178; Last Revised: July 13, 2010          DASH Diet: After Your Visit  Your Care Instructions  The DASH diet is an eating plan that can help lower your blood pressure. DASH stands for Dietary Approaches to Stop Hypertension. Hypertension is high blood pressure.  The DASH diet focuses on eating foods that are high in calcium, potassium,   and magnesium. These nutrients can lower blood pressure. The foods that are highest in these nutrients are fruits, vegetables, low-fat dairy products, nuts, seeds, and legumes. But taking calcium, potassium, and magnesium supplements instead of eating foods that are high in those nutrients does not have the same effect. The DASH diet also includes whole grains, fish, and poultry.  The DASH diet is one of several lifestyle changes your doctor may recommend to lower your high blood pressure. Your doctor may also want you to decrease the amount of sodium in your diet. Lowering sodium while following the DASH diet can lower blood pressure even further than just the DASH diet alone.  Follow-up care is a key part of your treatment and safety. Be sure to make and go to  all appointments, and call your doctor if you are having problems. It???s also a good idea to know your test results and keep a list of the medicines you take.  How can you care for yourself at home?  Following the DASH diet  ?? Eat 4 to 5 servings of fruit each day. A serving is 1 medium-sized piece of fruit, ?? cup chopped or canned fruit, 1/4 cup dried fruit, or 4 ounces (?? cup) of fruit juice. Choose fruit more often than fruit juice.   ?? Eat 4 to 5 servings of vegetables each day. A serving is 1 cup of lettuce or raw leafy vegetables, ?? cup of chopped or cooked vegetables, or 4 ounces (?? cup) of vegetable juice. Choose vegetables more often than vegetable juice.   ?? Get 2 to 3 servings of low-fat and fat-free dairy each day. A serving is 8 ounces of milk, 1 cup of yogurt, or 1 ?? ounces of cheese.   ?? Eat 7 to 8 servings of grains each day. A serving is 1 slice of bread, 1 ounce of dry cereal, or ?? cup of cooked rice, pasta, or cooked cereal. Try to choose whole-grain products as much as possible.   ?? Limit lean meat, poultry, and fish to 6 ounces each day. Six ounces is about the size of two decks of cards.   ?? Eat 4 to 5 servings of nuts, seeds, and legumes (cooked dried beans, lentils, and split peas) each week. A serving is 1/3 cup of nuts, 2 tablespoons of seeds, or ?? cup cooked dried beans or peas.   ?? Limit sweets and added sugars to 5 servings or less a week. A serving is 1 tablespoon jelly or jam, ?? cup sorbet, or 1 cup of lemonade.   Tips for success  ?? Start small. Do not try to make dramatic changes to your diet all at once. You might feel that you are missing out on your favorite foods and then be more likely to not follow the plan. Make small changes, and stick with them. Once those changes become habit, add a few more changes.   ?? Try some of the following:   ?? Make it a goal to eat a fruit or vegetable at every meal and at snacks. This will make it easy to get the recommended amount of fruits and  vegetables each day.   ?? Try yogurt topped with fruit and nuts for a snack or healthy dessert.   ?? Add lettuce, tomato, cucumber, and onion to sandwiches.   ?? Combine a ready-made pizza crust with low-fat mozzarella cheese and lots of vegetable toppings. Try using tomatoes, squash, spinach, broccoli, carrots, cauliflower, and onions.   ??   Have a variety of cut-up vegetables with a low-fat dip as an appetizer instead of chips and dip.   ?? Sprinkle sunflower seeds or chopped almonds over salads. Or try adding chopped walnuts or almonds to cooked vegetables.   ?? Try some vegetarian meals using beans and peas. Add garbanzo or kidney beans to salads. Make burritos and tacos with mashed pinto beans or black beans.     Where can you learn more?    Go to http://www.healthwise.net/[facility]   Enter H967 in the search box to learn more about "DASH Diet: After Your Visit."    ?? 2006-2013 Healthwise, Incorporated. Care instructions adapted under license by [employer] (which disclaims liability or warranty for this information). This care instruction is for use with your licensed healthcare professional. If you have questions about a medical condition or this instruction, always ask your healthcare professional. Healthwise, Incorporated disclaims any warranty or liability for your use of this information.  Content Version: 9.7.130178; Last Revised: November 22, 2010

## 2012-05-27 ENCOUNTER — Encounter

## 2012-05-27 LAB — METABOLIC PANEL, COMPREHENSIVE
A-G Ratio: 1.5 (ref 1.1–2.5)
ALT (SGPT): 35 IU/L — ABNORMAL HIGH (ref 0–32)
AST (SGOT): 36 IU/L (ref 0–40)
Albumin: 3.8 g/dL (ref 3.6–4.8)
Alk. phosphatase: 80 IU/L (ref 47–112)
BUN/Creatinine ratio: 16 (ref 11–26)
BUN: 10 mg/dL (ref 8–27)
Bilirubin, total: 0.6 mg/dL (ref 0.0–1.2)
CO2: 27 mmol/L (ref 19–28)
Calcium: 9.6 mg/dL (ref 8.6–10.2)
Chloride: 100 mmol/L (ref 97–108)
Creatinine: 0.63 mg/dL (ref 0.57–1.00)
GFR est AA: 112 mL/min/{1.73_m2} (ref >59)
GFR est non-AA: 97 mL/min/{1.73_m2} (ref >59)
GLOBULIN, TOTAL: 2.6 g/dL (ref 1.5–4.5)
Glucose: 103 mg/dL — ABNORMAL HIGH (ref 65–99)
Potassium: 4.2 mmol/L (ref 3.5–5.2)
Protein, total: 6.4 g/dL (ref 6.0–8.5)
Sodium: 141 mmol/L (ref 134–144)

## 2012-05-27 LAB — LIPID PANEL
Cholesterol, total: 297 mg/dL — ABNORMAL HIGH (ref 100–199)
HDL Cholesterol: 79 mg/dL (ref >39)
LDL, calculated: 205 mg/dL — ABNORMAL HIGH (ref 0–99)
Triglyceride: 66 mg/dL (ref 0–149)
VLDL, calculated: 13 mg/dL (ref 5–40)

## 2012-05-29 LAB — ENA+DNA/DS+SJOGREN'S
Anti-DNA (DS) Ab, QT: 28 IU/mL — ABNORMAL HIGH (ref 0–9)
RNP Abs: 0.2 AI (ref 0.0–0.9)
Sjogren's Anti-SS-A: <0.2 AI (ref 0.0–0.9)
Sjogren's Anti-SS-B: <0.2 AI (ref 0.0–0.9)
Smith Abs: <0.2 AI (ref 0.0–0.9)

## 2012-05-29 LAB — ANA, RFLX TO 5-BIOMARKER PROFILE(ENA)
ANA, DIRECT: POSITIVE — AB
Antinuclear Antibodies Direct: POSITIVE — AB

## 2012-05-29 LAB — CK: Creatine Kinase,Total: 110 U/L (ref 24–173)

## 2012-06-02 NOTE — Progress Notes (Signed)
Quick Note:    Pt was called re: her sx and ana/  ______

## 2012-06-03 ENCOUNTER — Encounter

## 2012-06-03 NOTE — Telephone Encounter (Signed)
Patient stated she is following up on a conversation she had with pcp regarding her lab results and information on rheumatologist she suggested   [phone]

## 2012-06-04 NOTE — Telephone Encounter (Signed)
Called Dr. Eliane Decree office and tried to scheduled patient appt, the person answering the phone advised that PCP has to contact Dr. Allena Katz to do a peer to peer contact before pt can get an earlier appt.

## 2012-06-04 NOTE — Telephone Encounter (Signed)
Patient was referred Dr. Allena Katz and to Can not get into see him until Dec and the only way she can get in sooner is if pcp or nurse calls to make the appt   Patient Ph: [phone]

## 2012-06-08 NOTE — Telephone Encounter (Signed)
Patient says she needs to see a rheumatologist, please call with the name.

## 2012-06-08 NOTE — Telephone Encounter (Signed)
Patient contacted [facility] in reference to this message. Will forward message to patients current PCP.

## 2012-06-10 NOTE — Telephone Encounter (Signed)
Called pt, left a v/m for pt to call back to make an appt to see PCP.

## 2012-06-15 NOTE — Telephone Encounter (Signed)
Spoke to pt, advised of appt with PCP first, pt agreed, appt scheduled.

## 2012-06-15 NOTE — Telephone Encounter (Signed)
Patient wants the nurse or Dr to call Dr Dawna Part RA dr to see if you can get her in sooner. She has appt in December. Her no is [phone]

## 2012-06-19 MED ORDER — PREDNISONE 5 MG TAB
5 mg | ORAL_TABLET | Freq: Every day | ORAL | Status: AC
Start: 2012-06-19 — End: ?

## 2012-06-19 NOTE — Patient Instructions (Signed)
Lupus: After Your Visit  Your Care Instructions  Lupus is a long-term disease that can cause inflammation, pain, and tissue damage in your body. It is an autoimmune disease. This means the immune system attacks its own tissues. Lupus may cause problems with your skin, kidneys, heart, lungs, nerves, or blood cells.  When you have lupus symptoms, you are having flares or relapses. When your symptoms get better, you are in remission. Lupus may get worse very quickly. There is no way to tell when a flare will happen or how bad it will be. When you have a lupus flare, you may have new symptoms as well as symptoms you have had in the past.  Learn your body's signs of a flare, such as joint pain, a rash, a fever, or being more tired. When you see any of these signs, take steps to control your symptoms.  Follow-up care is a key part of your treatment and safety. Be sure to make and go to all appointments, and call your doctor if you are having problems. It???s also a good idea to know your test results and keep a list of the medicines you take.  How can you care for yourself at home?  Reduce stress and tiredness  ?? Keep your daily schedule as simple as possible.  ?? Keep your list of things to do as short as you can.  ?? Exercise regularly. A daily walk or swim, for example, can lower stress, clear your head, improve your mood, and help fight tiredness.  ?? Use meditation, yoga, or guided imagery to relax.  ?? Get plenty of rest. Some people with lupus need up to 12 hours of sleep every night.  ?? Pace yourself. Do not do too many activities.  ?? Ask others for help. Do not try to do everything yourself.  ?? Take short breaks from your usual activities. Think about cutting down on work hours when your symptoms are severe.  ?? If you think that depression or anxiety is making you feel more tired, talk to your doctor, a mental health professional, or both.  Take care of your skin  ?? Ask your doctor about the use of corticosteroid  creams for skin symptoms.  ?? If you are bothered by the way a lupus rash looks on your face or if you have scars from lupus, you can try makeup, such as Covermark, to cover the rash or scars.  ?? Stay out of the sun, especially when the sun's rays are the strongest, usually between 10 a.m. and 4 p.m. If you must be in the sun, cover your arms and legs, wear a hat, and use sunscreen that protects against both UVA and UVB rays with an SPF of at least 50. Put more sunscreen on after swimming, sweating, or toweling off.  Practice good self-care  ?? Learn more about lupus and how to take care of yourself.  ?? Take your medicines exactly as prescribed. Call your doctor if you have any problems with your medicine.  ?? Do not smoke. If you need help quitting, talk to your doctor about stop-smoking programs and medicines. These can increase your chances of quitting for good.  ?? Eat a healthy, balanced diet. A balanced diet includes whole grains, dairy, fruits and vegetables, and protein. Eat a variety of foods from each of those groups so you get all the nutrients you need.  ?? Avoid other people who are sick with colds or the flu. These illnesses can cause   lupus flares. Talk to your doctor about flu shots and pneumococcal vaccinations. If you do get sick or think you are getting an infection, talk with your doctor so you can treat your symptoms right away.  ?? Brush and floss your teeth each day. See your dentist two times a year.  ?? Get regular eye exams.  ?? Build a support system of family, friends, and health professionals.  When should you call for help?  Call 911 anytime you think you may need emergency care. For example, call if:  ?? You have symptoms of a heart attack. These may include:   ?? Chest pain or pressure, or a strange feeling in the chest.  ?? Sweating.  ?? Shortness of breath.  ?? Nausea or vomiting.  ?? Pain, pressure, or a strange feeling in the back, neck, jaw, or upper belly or in one or both shoulders or arms.   ?? Lightheadedness or sudden weakness.  ?? A fast or irregular heartbeat.  After you call 911, the operator may tell you to chew 1 adult-strength or 2 to 4 low-dose aspirin. Wait for an ambulance. Do not try to drive yourself.  Call your doctor now or seek immediate medical care if:  ?? You are short of breath.  ?? You have blood in your urine or are urinating less often and in smaller amounts than usual.  ?? You have a fever.  ?? You feel depressed or notice any changes in your behavior or thinking.  ?? You are dizzy or have muscle weakness.  ?? You have swelling of the lower legs or feet.  Watch closely for changes in your health, and be sure to contact your doctor if:  ?? Your symptoms get worse or you develop any new symptoms. These may include aching or swollen joints, increased fatigue, loss of appetite, hair loss, skin rashes, or new sores in your mouth or nose.    Where can you learn more?    Go to [email or web address]   Enter H871 in the search box to learn more about "Lupus: After Your Visit."    ?? 2006-2013 [employer]. Care instructions adapted under license by [employer] (which disclaims liability or warranty for this information). This care instruction is for use with your licensed healthcare professional. If you have questions about a medical condition or this instruction, always ask your healthcare professional. Healthwise, Incorporated disclaims any warranty or liability for your use of this information.  Content Version: 9.8.193578; Last Revised: August 16, 2010

## 2012-06-19 NOTE — Progress Notes (Signed)
Casey Cowan is a 61 y.o. female and presents for follow up of her labs and ongoing fatigue.      Subjective:  Cardiovascular Review:  The patient has hyperlipidemia.  Diet and Lifestyle: generally follows a low fat low cholesterol diet  Home BP Monitoring: is not measured at home.  Pertinent ROS: she has stopped her crestor temporarily as instructed to observe for changes.  She reports since stopping she does feel slightly better, no TIA's, no chest pain on exertion, no dyspnea on exertion, no swelling of ankles.      Fatigue  She reports 6 months has been progressing  She has pain in fingers, knees, back, rarely shoulders, low back, joints in feet  Subjective low grade fevers but she does not measure  She is off her statin and feels a little better re: her joint pain.    She reports the joint pain keeps her from doing her work.  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    She reports involvement is symmetrical and notes pain in the  knees, carpal joints, and joints of the fingers, especially the proximal interphalangeal (PIP) joint. She denies ankles, elbows, shoulders, and hip pain.   She reports fatigue which seems to be progressive.    She denies fevers, weight loss, night sweats.      Lupus  She reports years ago when she was in her 20's she was tested for lupus but did not receive treatment because she did not have significant sx.      Review of Systems  Constitutional: negative for fevers, chills, anorexia and weight loss  Eyes:   negative for visual disturbance and irritation  ENT:   negative for tinnitus,sore throat,nasal congestion,ear pains.hoarseness  Respiratory:  negative for cough, hemoptysis, dyspnea,wheezing  CV:   negative for chest pain, palpitations, lower extremity edema  GI:   negative for nausea, vomiting, diarrhea, abdominal pain,melena  Endo:               negative for polyuria,polydipsia,polyphagia,heat intolerance   Genitourinary: negative for frequency, dysuria and hematuria  Integument:  negative for rash and pruritus  Hematologic:  negative for easy bruising and gum/nose bleeding  Musculoskel: negative for myalgias, arthralgias, back pain, muscle weakness, joint pain  Neurological:  negative for headaches, dizziness, vertigo, memory problems and gait   Behavl/Psych: negative for feelings of anxiety, depression, mood changes    Past Medical History   Diagnosis Date   ??? Morbid obesity    ??? Lupus      in remission   ??? Mitral valve prolapse    ??? Anemia NEC      b12 defiency   ??? Hypercholesterolemia    ??? H/O: pneumonia 1970's, 1999   ??? Asthma      Past Surgical History   Procedure Date   ??? Hx hysterectomy 1998   ??? Hx cholecystectomy 1983   ??? Hx myomectomy 1990   ??? Hx cesarean section 1991   ??? 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 Hx lysis of adhesions 03/08/02   ??? Endoscopy, colon, diagnostic 2003     neg.   ??? Hx breast biopsy      negative     History     Social History   ??? Marital Status: MARRIED     Spouse Name: N/A     Number of Children: N/A   ??? Years of  Education: N/A     Social History Main Topics   ??? Smoking status: Former Smoker   ??? Smokeless tobacco: Never Used   ??? Alcohol Use: No   ??? Drug Use: No   ??? Sexually Active: Not on file     Other Topics Concern   ??? 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 Stroke Mother    ??? Hypertension Father    ??? COPD Father    ??? Heart Attack Father      (1st @ 86)   ??? Alcohol abuse Brother      Current Outpatient Prescriptions   Medication Sig Dispense Refill   ??? citalopram (CELEXA) 10 mg tablet Take 1 Tab by mouth daily.  30 Tab  0   ??? naproxen sodium (ALEVE) 220 mg cap Take  by mouth as needed.       ??? ergocalciferol (VITAMIN D2) 50,000 unit capsule Take 1 Cap by mouth every  Tuesday and Thursday.  24 Cap  1   ??? cyanocobalamin (NASCOBAL) 500 mcg Spry 1 Squirt by Nasal route every seven (7) days. 1 squirt in 1 nostril.  2.3 mL  6   ??? rosuvastatin (CRESTOR) 40 mg tablet Take 1 Tab by mouth daily.  30 Tab  11   ??? multivitamin (ONE A DAY) tablet Take 1 Tab by mouth daily.         ??? CALCIUM CARBONATE/VITAMIN D3 (CALTRATE 600 + D PO) Take  by mouth.         ??? VITAMIN B COMPLEX NO.12-NIACIN PO Take  by mouth.       ??? gabapentin (NEURONTIN) 100 mg capsule Take 1 po at night can increase to 3po at night  30 Cap  1   ??? mometasone (NASONEX) 50 mcg/actuation nasal spray 2 Sprays daily.       ??? ezetimibe (ZETIA) 10 mg tablet Take 1 Tab by mouth daily.  30 Tab  5     Allergies   Allergen Reactions   ??? Iodinated Contrast Media - Iv Dye Hives and Other (comments)     fever   ??? Pcn (Penicillins) Other (comments)     Lethargic and chills   ??? Zocor (Simvastatin) Myalgia       Objective:  BP 157/84   Pulse 82   Temp 98.6 ??F (37 ??C) (Oral)   Resp 20   Ht 5\' 4"  (1.626 m)   Wt 248 lb (112.492 kg)   BMI 42.57 kg/m2   SpO2 99%  Physical Exam:   General appearance - alert, well appearing, and in no distress  Mental status - alert, oriented to person, place, and time  EYE-PERLA, EOMI, fundi normal, corneas normal, no foreign bodies, visual acuity normal both eyes, no periorbital cellulitis  ENT-ENT exam normal, no neck nodes or sinus tenderness  Nose - normal and patent, no erythema, discharge or polyps  Mouth - mucous membranes moist, pharynx normal without lesions  Neck - supple, no significant adenopathy   Chest - clear to auscultation, no wheezes, rales or rhonchi, symmetric air entry   Heart - normal rate, regular rhythm, normal S1, S2, no murmurs, rubs, clicks or gallops   Abdomen - soft, nontender, nondistended, no masses or organomegaly  Lymph- no adenopathy palpable  Ext-peripheral pulses normal, no pedal edema, no clubbing or cyanosis  Skin-Warm and dry. no hyperpigmentation, vitiligo, or suspicious  lesions  Neuro -alert, oriented, normal speech, no focal findings or movement disorder noted  Neck-normal C-spine, no tenderness, full ROM without pain      Results for orders placed in visit on 05/27/12   CK       Component Value Range    Creatine Kinase,Total 110  24 - 173 U/L   ANA W/REFLEX       Component Value Range    Antinuclear Antibodies Direct Positive (*) Negative   ENA+DNA/DS+SJORGEN'S       Component Value Range    Anti-DNA (DS) Ab, QT 28 (*) 0 - 9 IU/mL    RNP Abs 0.2  0.0 - 0.9 AI    Smith Abs <0.2  0.0 - 0.9 AI    Sjogren's Anti-SS-A <0.2  0.0 - 0.9 AI    Sjogren's Anti-SS-B <0.2  0.0 - 0.9 AI    See below Comment         Assessment/Plan:  1. Hypercholesterolemia        She may be a candidate for Livalo   We will hold for 3 more weeks to see if her sx resolve off crestor   2. lupus Trial prednisone offered   rheumatolgy consult   3. Gastric bypass stable   4.     5. Glucose intolerance (impaired glucose tolerance)   Monitor high risk   HEMOGLOBIN A1C   6. Fatigue      Consider re echo  May be due to lupus  multifactorial       7. Hot flashes   Stable,  Query re: sx at next visit    htn- not controlled gabapentin (NEURONTIN) 100 mg capsule      Prior readings wnl, monitor, may need ace     No orders of the defined types were placed in this encounter.     lose weight, increase physical activity, bring BP log to office visit, follow low fat diet, follow low salt diet, dash diet  There are no Patient Instructions on file for this visit.

## 2012-07-06 MED ORDER — BUPIVACAINE-EPINEPHRINE (PF) 0.5 %-1:200,000 IJ SOLN
0.5 %-1:200,000 | INTRAMUSCULAR | Status: AC
Start: 2012-07-06 — End: ?

## 2012-07-06 MED FILL — BUPIVACAINE-EPINEPHRINE (PF) 0.5 %-1:200,000 IJ SOLN: 0.5 %-1:200,000 | INTRAMUSCULAR | Qty: 30

## 2012-07-06 NOTE — Telephone Encounter (Signed)
Called pt, pt did not answer, left a v/m for pt to call back.

## 2012-07-06 NOTE — Telephone Encounter (Signed)
Patient would like to be called today with her lab results her no is [phone]

## 2012-08-03 NOTE — Telephone Encounter (Signed)
Left message encouraging patient to have a colonoscopy and pneumonia vaccine.  Encouraged patient to call physician's office to schedule an appointment.    TEST TAKEN  MAMMOGRAM: 10-03-2010    COLONOSCOPY:  11-20-2001    PNEUMONIA VACCINE:  None    PAP SMEAR:  11-17 2011

## 2012-11-20 MED ORDER — GABAPENTIN 100 MG CAP
100 mg | ORAL_CAPSULE | ORAL | Status: AC
Start: 2012-11-20 — End: ?

## 2012-11-20 NOTE — Progress Notes (Signed)
Casey Cowan is a 62 y.o. female and presents for follow up of her labs and ongoing fatigue.    Subjective:  Cardiovascular Review:  The patient has hyperlipidemia.  Diet and Lifestyle: generally follows a low fat low cholesterol diet  Home BP Monitoring: is not measured at home.  Pertinent ROS: she has stopped her crestor temporarily as instructed to observe for changes.  She reports since stopping she does feel slightly better, no TIA's, no chest pain on exertion, no dyspnea on exertion, no swelling of ankles.      Fatigue  Followed with rheum not diagnosed with Lupus  Also libido issues  She is not exercising or compliant with diet.      Review of Systems  Constitutional: negative for fevers, chills, anorexia and weight loss  Eyes:   negative for visual disturbance and irritation  ENT:   negative for tinnitus,sore throat,nasal congestion,ear pains.hoarseness  Respiratory:  negative for cough, hemoptysis, dyspnea,wheezing  CV:   negative for chest pain, palpitations, lower extremity edema  GI:   negative for nausea, vomiting, diarrhea, abdominal pain,melena  Endo:               negative for polyuria,polydipsia,polyphagia,heat intolerance  Genitourinary: negative for frequency, dysuria and hematuria  Integument:  negative for rash and pruritus  Hematologic:  negative for easy bruising and gum/nose bleeding  Musculoskel: negative for myalgias, arthralgias, back pain, muscle weakness, joint pain  Neurological:  negative for headaches, dizziness, vertigo, memory problems and gait   Behavl/Psych: negative for feelings of anxiety, depression, mood changes    Past Medical History   Diagnosis Date   ??? Morbid obesity    ??? Lupus      in remission   ??? Mitral valve prolapse    ??? Anemia NEC      b12 defiency   ??? Hypercholesterolemia    ??? H/O: pneumonia 1970's, 1999   ??? Asthma      Past Surgical History   Procedure Laterality Date   ??? Hx hysterectomy  1998   ??? Hx cholecystectomy  1983   ??? Hx myomectomy  1990   ??? Hx  cesarean section  1991   ??? 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 Hx lysis of adhesions  03/08/02   ??? Endoscopy, colon, diagnostic  2003     neg.   ??? Hx breast biopsy       negative     History     Social History   ??? Marital Status: MARRIED     Spouse Name: N/A     Number of Children: N/A   ??? Years of Education: N/A     Social History Main Topics   ??? Smoking status: Former Smoker   ??? Smokeless tobacco: Never Used   ??? Alcohol Use: No   ??? Drug Use: No   ??? Sexually Active: Not on file     Other Topics Concern   ??? Not on file     Social History Narrative    62 yo     63yo Sherrie Sport, daughter neuropsychology & criminal justice  [employer]        Retired from Sports administrator     Family History   Problem Relation Age of Onset   ??? Stroke Mother    ??? Hypertension Father    ??? COPD Father    ??? Heart Attack Father      (1st @  62)   ??? Alcohol abuse Brother      Current Outpatient Prescriptions   Medication Sig Dispense Refill   ??? ergocalciferol (VITAMIN D2) 50,000 unit capsule Take 1 Cap by mouth every Tuesday and Thursday.  24 Cap  1   ??? multivitamin (ONE A DAY) tablet Take 1 Tab by mouth daily.         ??? CALCIUM CARBONATE/VITAMIN D3 (CALTRATE 600 + D PO) Take  by mouth.         ??? predniSONE (DELTASONE) 5 mg tablet Take 1 Tab by mouth daily.  15 Tab  0   ??? citalopram (CELEXA) 10 mg tablet Take 1 Tab by mouth daily.  30 Tab  0   ??? naproxen sodium (ALEVE) 220 mg cap Take  by mouth as needed.       ??? cyanocobalamin (NASCOBAL) 500 mcg Spry 1 Squirt by Nasal route every seven (7) days. 1 squirt in 1 nostril.  2.3 mL  6   ??? VITAMIN B COMPLEX NO.12-NIACIN PO Take  by mouth.       ??? gabapentin (NEURONTIN) 100 mg capsule Take 1 po at night can increase to 3po at night  30 Cap  1   ??? mometasone (NASONEX) 50 mcg/actuation nasal spray 2 Sprays daily.       ??? rosuvastatin (CRESTOR) 40 mg tablet Take 1 Tab by mouth daily.  30 Tab  11   ??? ezetimibe (ZETIA) 10 mg tablet Take 1 Tab by mouth  daily.  30 Tab  5     Allergies   Allergen Reactions   ??? Iodinated Contrast Media - Iv Dye Hives and Other (comments)     fever   ??? Pcn (Penicillins) Other (comments)     Lethargic and chills   ??? Zocor (Simvastatin) Myalgia       Objective:  BP 110/93   Pulse 78   Temp(Src) 98 ??F (36.7 ??C) (Oral)   Resp 16   Ht 5\' 4"  (1.626 m)   Wt 249 lb (112.946 kg)   BMI 42.72 kg/m2   SpO2 100%  Physical Exam:   General appearance - alert, well appearing, and in no distress, obese  Mental status - alert, oriented to person, place, and time  EYE-PERLA, EOMI, fundi normal, corneas normal, no foreign bodies, visual acuity normal both eyes, no periorbital cellulitis  ENT-ENT exam normal, no neck nodes or sinus tenderness  Nose - normal and patent, no erythema, discharge or polyps  Mouth - mucous membranes moist, pharynx normal without lesions  Neck - supple, no significant adenopathy   Chest - clear to auscultation, no wheezes, rales or rhonchi, symmetric air entry   Heart - normal rate, regular rhythm, normal S1, S2, no murmurs, rubs, clicks or gallops   Abdomen - soft, nontender, nondistended, no masses or organomegaly, obese  Lymph- no adenopathy palpable  Ext-peripheral pulses normal, no pedal edema, no clubbing or cyanosis  Skin-Warm and dry. no hyperpigmentation, vitiligo, or suspicious lesions  Neuro -alert, oriented, normal speech, no focal findings or movement disorder noted  Neck-normal C-spine, no tenderness, full ROM without pain      Results for orders placed in visit on 05/27/12   CK       Result Value Range    Creatine Kinase,Total 110  24 - 173 U/L   ANA W/REFLEX       Result Value Range    Antinuclear Antibodies Direct Positive (*) Negative   ENA+DNA/DS+SJORGEN'S  Result Value Range    Anti-DNA (DS) Ab, QT 28 (*) 0 - 9 IU/mL    RNP Abs 0.2  0.0 - 0.9 AI    Smith Abs <0.2  0.0 - 0.9 AI    Sjogren's Anti-SS-A <0.2  0.0 - 0.9 AI    Sjogren's Anti-SS-B <0.2  0.0 - 0.9 AI    See below Comment       Prevention     Cardiovascular profile  Family hx  Exercising:    Blood pressure:  Health healthy diet:  Diabetes:  Cholesterol:  Renal function:      Cancer risk profile  Mammogram 2014 good  Lung none in 24 years cis  Colonoscopy  Skin nonhealing in 2 weeks  Gyn abnormal bleeding/discharge/abd pain/pressure      Thyroid sx    Osteopenia prevention  Calcium 1000mg /day yes  Vitamin D 800iu/day yes    Mental health scale: 6.5mg  libod  Depression  Anxiety  Sleep # of hours:  Energy Level:        Immunizations  TDAP  Pneumonia vaccine  Flu vaccine  Shingles vaccine  HPV        Casey Cowan was seen today for complete physical.  62 yo who who presents with fatigue and issues as noted below.  Discussion that she is going to work on improving her sleep, exercise and diet.    She has not been compliant.  She understands that her other problems fatigue and libido may improve with behavioral changes.    Diagnoses and associated orders for this visit:    Anemia, B12 deficiency  Recheck levels  - CBC W/O DIFF  - VITAMIN B12 & FOLATE    Vitamin D deficiency  - VITAMIN D, 25 HYDROXY    Hypercholesteremia  - LIPID PANEL    Lupus  Evaluated by Dr. Theodoro Parma no lupus    Hypercholesterolemia    Asthma  Stable cont meds    Fatigue  - CBC W/O DIFF  - METABOLIC PANEL, COMPREHENSIVE  - TSH, 3RD GENERATION    Glucose intolerance (impaired glucose tolerance)  - HEMOGLOBIN A1C    Hot flashes  - gabapentin (NEURONTIN) 100 mg capsule; Take 1 po at night can increase to 3po at night    Libido, decreased  - gabapentin (NEURONTIN) 100 mg capsule; Take 1 po at night can increase to 3po at night    Screening for colon cancer  - REFERRAL TO GASTROENTEROLOGY

## 2012-11-21 LAB — TSH 3RD GENERATION: TSH: 1.05 u[IU]/mL (ref 0.450–4.500)

## 2012-11-21 LAB — METABOLIC PANEL, COMPREHENSIVE
A-G Ratio: 1.4 (ref 1.1–2.5)
ALT (SGPT): 21 IU/L (ref 0–32)
AST (SGOT): 25 IU/L (ref 0–40)
Albumin: 4 g/dL (ref 3.6–4.8)
Alk. phosphatase: 91 IU/L (ref 39–117)
BUN/Creatinine ratio: 17 (ref 11–26)
BUN: 11 mg/dL (ref 8–27)
Bilirubin, total: 0.5 mg/dL (ref 0.0–1.2)
CO2: 30 mmol/L — ABNORMAL HIGH (ref 19–28)
Calcium: 9.5 mg/dL (ref 8.6–10.2)
Chloride: 99 mmol/L (ref 97–108)
Creatinine: 0.63 mg/dL (ref 0.57–1.00)
GFR est AA: 111 mL/min/{1.73_m2} (ref >59)
GFR est non-AA: 96 mL/min/{1.73_m2} (ref >59)
GLOBULIN, TOTAL: 2.9 g/dL (ref 1.5–4.5)
Glucose: 97 mg/dL (ref 65–99)
Potassium: 4.3 mmol/L (ref 3.5–5.2)
Protein, total: 6.9 g/dL (ref 6.0–8.5)
Sodium: 141 mmol/L (ref 134–144)

## 2012-11-21 LAB — CBC W/O DIFF
HCT: 41.1 % (ref 34.0–46.6)
HGB: 12.9 g/dL (ref 11.1–15.9)
MCH: 26.1 pg — ABNORMAL LOW (ref 26.6–33.0)
MCHC: 31.4 g/dL — ABNORMAL LOW (ref 31.5–35.7)
MCV: 83 fL (ref 79–97)
PLATELET: 265 10*3/uL (ref 155–379)
RBC: 4.95 x10E6/uL (ref 3.77–5.28)
RDW: 16.6 % — ABNORMAL HIGH (ref 12.3–15.4)
WBC: 5.8 10*3/uL (ref 3.4–10.8)

## 2012-11-21 LAB — LIPID PANEL
Cholesterol, total: 373 mg/dL — ABNORMAL HIGH (ref 100–199)
HDL Cholesterol: 86 mg/dL (ref >39)
LDL, calculated: 274 mg/dL — ABNORMAL HIGH (ref 0–99)
Triglyceride: 67 mg/dL (ref 0–149)
VLDL, calculated: 13 mg/dL (ref 5–40)

## 2012-11-21 LAB — HEMOGLOBIN A1C WITH EAG: Hemoglobin A1c: 6.5 % — ABNORMAL HIGH (ref 4.8–5.6)

## 2012-11-21 LAB — VITAMIN B12 & FOLATE
Folate: 14.7 ng/mL (ref >3.0)
Vitamin B12: 296 pg/mL (ref 211–946)

## 2012-11-21 LAB — VITAMIN D, 25 HYDROXY: VITAMIN D, 25-HYDROXY: 23.4 ng/mL — ABNORMAL LOW (ref 30.0–100.0)

## 2012-11-30 NOTE — Telephone Encounter (Signed)
Spoke to pt, pt has questions about her CBS,A1c, and lipids. Pt questioning her result letter. Advised f/u with PCP for explanation and also advised to plan her meal with a heart heathy diet. Pt understood.

## 2012-11-30 NOTE — Telephone Encounter (Signed)
Patient asked that nurse call her and explain lab results  Received them in the mail

## 2012-12-29 MED ORDER — PITAVASTATIN 2 MG TAB
2 mg | ORAL_TABLET | Freq: Every day | ORAL | Status: DC
Start: 2012-12-29 — End: 2013-03-26

## 2012-12-29 NOTE — Progress Notes (Signed)
Casey Cowan is a 62 y.o. female and presents for follow up of her labs and ongoing fatigue.    Subjective:    Cardiovascular Review:  The patient has hyperlipidemia.  Diet and Lifestyle: generally follows a low fat low cholesterol diet  Home BP Monitoring: is not measured at home.  Pertinent ROS: she has stopped her crestor temporarily as instructed to observe for changes.  She reports since stopping she does feel slightly better, no TIA's, no chest pain on exertion, no dyspnea on exertion, no swelling of ankles.  oveall stable      Fatigue  Followed with rheum not diagnosed with Lupus  Walking 10-15 minutes per day,   Weights none  Reviewed labs and no source for correctable fatigue    Hot flashes  Did not take gabapentin      Review of Systems  Constitutional: negative for fevers, chills, anorexia and weight loss  Eyes:   negative for visual disturbance and irritation  ENT:   negative for tinnitus,sore throat,nasal congestion,ear pains.hoarseness  Respiratory:  negative for cough, hemoptysis, dyspnea,wheezing  CV:   negative for chest pain, palpitations, lower extremity edema  GI:   negative for nausea, vomiting, diarrhea, abdominal pain,melena  Endo:               negative for polyuria,polydipsia,polyphagia,heat intolerance  Genitourinary: negative for frequency, dysuria and hematuria  Integument:  negative for rash and pruritus  Hematologic:  negative for easy bruising and gum/nose bleeding  Musculoskel: negative for myalgias, arthralgias, back pain, muscle weakness, joint pain  Neurological:  negative for headaches, dizziness, vertigo, memory problems and gait   Behavl/Psych: negative for feelings of anxiety, depression, mood changes    Past Medical History   Diagnosis Date   ??? Morbid obesity    ??? Lupus      in remission   ??? Mitral valve prolapse    ??? Anemia NEC      b12 defiency   ??? Hypercholesterolemia    ??? H/O: pneumonia 1970's, 1999   ??? Asthma      Past Surgical History   Procedure Laterality  Date   ??? Hx hysterectomy  1998   ??? Hx cholecystectomy  1983   ??? Hx myomectomy  1990   ??? Hx cesarean section  1991   ??? 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 Hx lysis of adhesions  03/08/02   ??? Endoscopy, colon, diagnostic  2003     neg.   ??? Hx breast biopsy       negative     History     Social History   ??? Marital Status: MARRIED     Spouse Name: N/A     Number of Children: N/A   ??? Years of Education: N/A     Social History Main Topics   ??? Smoking status: Former Smoker   ??? Smokeless tobacco: Never Used   ??? Alcohol Use: No   ??? Drug Use: No   ??? Sexually Active: Not on file     Other Topics Concern   ??? Not on file     Social History Narrative    62 yo     62yo Sherrie Sport, daughter neuropsychology & criminal justice  [place]        Retired from Sports administrator     Family History   Problem Relation Age of Onset   ??? Stroke Mother    ??? Hypertension  Father    ??? COPD Father    ??? Heart Attack Father      (1st @ 18)   ??? Alcohol abuse Brother      Current Outpatient Prescriptions   Medication Sig Dispense Refill   ??? rosuvastatin (CRESTOR) 40 mg tablet Take 1 Tab by mouth daily.  30 Tab  11   ??? multivitamin (ONE A DAY) tablet Take 1 Tab by mouth daily.         ??? CALCIUM CARBONATE/VITAMIN D3 (CALTRATE 600 + D PO) Take  by mouth.         ??? gabapentin (NEURONTIN) 100 mg capsule Take 1 po at night can increase to 3po at night  30 Cap  1   ??? predniSONE (DELTASONE) 5 mg tablet Take 1 Tab by mouth daily.  15 Tab  0   ??? citalopram (CELEXA) 10 mg tablet Take 1 Tab by mouth daily.  30 Tab  0   ??? naproxen sodium (ALEVE) 220 mg cap Take  by mouth as needed.       ??? ergocalciferol (VITAMIN D2) 50,000 unit capsule Take 1 Cap by mouth every Tuesday and Thursday.  24 Cap  1   ??? cyanocobalamin (NASCOBAL) 500 mcg Spry 1 Squirt by Nasal route every seven (7) days. 1 squirt in 1 nostril.  2.3 mL  6   ??? VITAMIN B COMPLEX NO.12-NIACIN PO Take  by mouth.       ??? mometasone (NASONEX) 50 mcg/actuation  nasal spray 2 Sprays daily.       ??? ezetimibe (ZETIA) 10 mg tablet Take 1 Tab by mouth daily.  30 Tab  5     Allergies   Allergen Reactions   ??? Iodinated Contrast Media - Iv Dye Hives and Other (comments)     fever   ??? Pcn (Penicillins) Other (comments)     Lethargic and chills   ??? Zocor (Simvastatin) Myalgia       Objective:  BP 182/94   Pulse 60   Temp(Src) 98.1 ??F (36.7 ??C) (Oral)   Resp 16   Ht 5\' 4"  (1.626 m)   Wt 249 lb (112.946 kg)   BMI 42.72 kg/m2   SpO2 96%  Physical Exam:   General appearance - alert, well appearing, and in no distress, obese  Mental status - alert, oriented to person, place, and time  EYE-PERLA, EOMI, fundi normal, corneas normal, no foreign bodies, visual acuity normal both eyes, no periorbital cellulitis  ENT-ENT exam normal, no neck nodes or sinus tenderness  Nose - normal and patent, no erythema, discharge or polyps  Mouth - mucous membranes moist, pharynx normal without lesions  Neck - supple, no significant adenopathy   Chest - clear to auscultation, no wheezes, rales or rhonchi, symmetric air entry   Heart - normal rate, regular rhythm, normal S1, S2, no murmurs, rubs, clicks or gallops   Abdomen - soft, nontender, nondistended, no masses or organomegaly, obese  Lymph- no adenopathy palpable  Ext-peripheral pulses normal, no pedal edema, no clubbing or cyanosis  Skin-Warm and dry. no hyperpigmentation, vitiligo, or suspicious lesions  Neuro -alert, oriented, normal speech, no focal findings or movement disorder noted  Neck-normal C-spine, no tenderness, full ROM without pain      Results for orders placed in visit on 11/20/12   CBC W/O DIFF       Result Value Range    WBC 5.8  3.4 - 10.8 x10E3/uL    RBC 4.95  3.77 -  5.28 x10E6/uL    HGB 12.9  11.1 - 15.9 g/dL    HCT 16.1  09.6 - 04.5 %    MCV 83  79 - 97 fL    MCH 26.1 (*) 26.6 - 33.0 pg    MCHC 31.4 (*) 31.5 - 35.7 g/dL    RDW 40.9 (*) 81.1 - 15.4 %    PLATELET 265  155 - 379 x10E3/uL   HEMOGLOBIN A1C       Result Value Range     Hemoglobin A1c 6.5 (*) 4.8 - 5.6 %   LIPID PANEL       Result Value Range    Cholesterol, total 373 (*) 100 - 199 mg/dL    Triglyceride 67  0 - 149 mg/dL    HDL Cholesterol 86  >39 mg/dL    VLDL, calculated 13  5 - 40 mg/dL    LDL, calculated 914 (*) 0 - 99 mg/dL    Comment Comment     METABOLIC PANEL, COMPREHENSIVE       Result Value Range    Glucose 97  65 - 99 mg/dL    BUN 11  8 - 27 mg/dL    Creatinine 7.82  9.56 - 1.00 mg/dL    GFR est non-AA 96  >21 mL/min/1.73    GFR est AA 111  >59 mL/min/1.73    BUN/Creatinine ratio 17  11 - 26    Sodium 141  134 - 144 mmol/L    Potassium 4.3  3.5 - 5.2 mmol/L    Chloride 99  97 - 108 mmol/L    CO2 30 (*) 19 - 28 mmol/L    Calcium 9.5  8.6 - 10.2 mg/dL    Protein, total 6.9  6.0 - 8.5 g/dL    Albumin 4.0  3.6 - 4.8 g/dL    GLOBULIN, TOTAL 2.9  1.5 - 4.5 g/dL    A-G Ratio 1.4  1.1 - 2.5    Bilirubin, total 0.5  0.0 - 1.2 mg/dL    Alk. phosphatase 91  39 - 117 IU/L    AST 25  0 - 40 IU/L    ALT 21  0 - 32 IU/L   TSH, 3RD GENERATION       Result Value Range    TSH 1.050  0.450 - 4.500 uIU/mL   VITAMIN D, 25 HYDROXY       Result Value Range    VITAMIN D, 25-HYDROXY 23.4 (*) 30.0 - 100.0 ng/mL   VITAMIN B12 & FOLATE       Result Value Range    Vitamin B12 296  211 - 946 pg/mL    Folate 14.7  >3.0 ng/mL     Prevention    Cardiovascular profile  Family hx  Exercising:    Blood pressure:  Health healthy diet:  Diabetes:  Cholesterol:  Renal function:      Cancer risk profile  Mammogram 2014 good  Lung none in 24 years cis  Colonoscopy  Skin nonhealing in 2 weeks  Gyn abnormal bleeding/discharge/abd pain/pressure      Thyroid sx    Osteopenia prevention  Calcium 1000mg /day yes  Vitamin D 800iu/day yes    Mental health scale: 6.5mg  libod  Depression  Anxiety  Sleep # of hours:  Energy Level:        Immunizations  TDAP  Pneumonia vaccine  Flu vaccine  Shingles vaccine  HPV    Jovana was seen today for abnormal lab results.  Diagnoses and associated orders for this visit:    Fatigue   Ongoing sx  Multifactorial due to lack of sleep from hot flashes, s/p gastric bypass, obesity, possiby some underlying anxiety.  She will discuss with gyn re: gabapentin and if takes will return to clinic in 3 months.    Hypercholesteremia  See below needs to try livalo as not currently controlled with crestor and with se of crestor    Morbid obesity  Mvi, sp bypass  Needs follow up with surgeons    Hot flashes    Cholesterol  Not controlled   Needs to try livalo  Failed crestor and other statins  - pitavastatin (LIVALO) 2 mg tablet; Take 1 Tab by mouth daily.

## 2012-12-29 NOTE — Patient Instructions (Signed)
Fatigue: After Your Visit  Your Care Instructions  Fatigue is a feeling of tiredness, exhaustion, or lack of energy. You may feel fatigue because of too much or not enough activity. It can also come from stress, lack of sleep, boredom, and poor diet. Many medical problems, such as viral infections, can cause fatigue. Emotional problems, especially depression, are often the cause of fatigue.  Fatigue is most often a symptom of another problem. Treatment for fatigue depends on the cause. For example, if you have fatigue because you have a certain health problem, treating this problem also treats your fatigue. If depression or anxiety is the cause, treatment may help.  Follow-up care is a key part of your treatment and safety. Be sure to make and go to all appointments, and call your doctor if you are having problems. It's also a good idea to know your test results and keep a list of the medicines you take.  How can you care for yourself at home?  ?? Get regular exercise. But don't overdo it. Go back and forth between rest and exercise.  ?? Get plenty of rest.  ?? Eat a healthy diet. Do not skip meals, especially breakfast.  ?? Reduce your use of caffeine, tobacco, and alcohol. Caffeine is most often found in coffee, tea, cola drinks, and chocolate.  ?? Limit medicines that can cause fatigue. This includes tranquilizers and cold and allergy medicines.  When should you call for help?  Watch closely for changes in your health, and be sure to contact your doctor if:  ?? You have new symptoms such as fever or a rash.  ?? Your fatigue gets worse.  ?? You have been feeling down, depressed, or hopeless. Or you may have lost interest in things that you usually enjoy.  ?? You are not getting better as expected.   Where can you learn more?   Go to http://www.healthwise.net/[facility]  Enter W864 in the search box to learn more about "Fatigue: After Your Visit."   ?? 2006-2014 Healthwise, [employer]. Care instructions adapted under  license by [employer] (which disclaims liability or warranty for this information). This care instruction is for use with your licensed healthcare professional. If you have questions about a medical condition or this instruction, always ask your healthcare professional. Healthwise, Incorporated disclaims any warranty or liability for your use of this information.  Content Version: 10.0.270728; Last Revised: Jan 24, 2012

## 2013-02-22 NOTE — Telephone Encounter (Signed)
Patient has relocated to [place] and wants to know if Dr. Danella Deis has any suggestions on pcp in that area  [phone]

## 2013-02-26 NOTE — Telephone Encounter (Signed)
Left v/m for pt stating Dr. Lujean Amel doesn't know of any internist to recommend for her in [facility]

## 2013-03-02 ENCOUNTER — Encounter

## 2013-03-02 MED ORDER — ERGOCALCIFEROL (VITAMIN D2) 50,000 UNIT CAP
1250 mcg (50,000 unit) | ORAL_CAPSULE | ORAL | Status: DC
Start: 2013-03-02 — End: 2013-03-26

## 2013-03-02 NOTE — Telephone Encounter (Signed)
She is in [place] now and needs a refill on the medication Vit D  Pharmacy no [phone] pt no is [phone]

## 2013-03-04 DIAGNOSIS — L930 Discoid lupus erythematosus: Secondary | ICD-10-CM | POA: Insufficient documentation

## 2013-03-26 ENCOUNTER — Encounter

## 2013-03-26 MED ORDER — PITAVASTATIN 2 MG TAB
2 mg | ORAL_TABLET | Freq: Every day | ORAL | Status: AC
Start: 2013-03-26 — End: ?

## 2013-03-26 MED ORDER — ERGOCALCIFEROL (VITAMIN D2) 50,000 UNIT CAP
1250 mcg (50,000 unit) | ORAL_CAPSULE | ORAL | Status: AC
Start: 2013-03-26 — End: ?

## 2013-03-26 NOTE — Telephone Encounter (Signed)
Pt is requesting Rx for her cholesterol medication she has moved to [facility] and pharmacy is needing an authorization for this medication Pt contact info [phone]

## 2013-03-26 NOTE — Telephone Encounter (Signed)
Spoke to pt, pt has moved to [facility], she is requesting refills of Livalo and Ergocalcifero until she gets an appt with a new PCP. Her appt with the new PCP will be the end of August, 2014. Advised to keep appt, pt understood.

## 2013-04-06 ENCOUNTER — Encounter

## 2013-04-06 MED ORDER — ROSUVASTATIN 40 MG TAB
40 mg | ORAL_TABLET | Freq: Every evening | ORAL | Status: AC
Start: 2013-04-06 — End: ?

## 2013-04-06 NOTE — Telephone Encounter (Signed)
Patient would like the medication for crestor to be called into her pharmacy. She stated that she has been without her medication for 2 month because her insurance will not pay for the current medication that she as prescribed. It is ot be refilled at [facility] pharmacy in [place]. The contact number is [phone]. Patient would like a call back.

## 2013-04-06 NOTE — Telephone Encounter (Signed)
Pt only wanted enough to last her until she established a new PCP in August 2014.

## 2013-05-04 DIAGNOSIS — E559 Vitamin D deficiency, unspecified: Secondary | ICD-10-CM | POA: Insufficient documentation

## 2013-05-04 DIAGNOSIS — H9319 Tinnitus, unspecified ear: Secondary | ICD-10-CM | POA: Insufficient documentation

## 2013-05-04 DIAGNOSIS — E7849 Other hyperlipidemia: Secondary | ICD-10-CM | POA: Insufficient documentation

## 2013-05-04 DIAGNOSIS — M1990 Unspecified osteoarthritis, unspecified site: Secondary | ICD-10-CM | POA: Insufficient documentation

## 2013-05-04 DIAGNOSIS — I341 Nonrheumatic mitral (valve) prolapse: Secondary | ICD-10-CM | POA: Insufficient documentation

## 2013-05-04 DIAGNOSIS — E538 Deficiency of other specified B group vitamins: Secondary | ICD-10-CM | POA: Insufficient documentation

## 2013-05-04 DIAGNOSIS — R42 Dizziness and giddiness: Secondary | ICD-10-CM | POA: Insufficient documentation

## 2013-05-04 NOTE — Telephone Encounter (Signed)
Pt would like to know the name of the new cholesterol medication that Dr Hilda Lias placed her on Pt contact info [phone]

## 2013-05-04 NOTE — Telephone Encounter (Signed)
Left v/m asking pt to call back.

## 2013-11-01 DIAGNOSIS — R7301 Impaired fasting glucose: Secondary | ICD-10-CM | POA: Insufficient documentation

## 2014-01-07 DIAGNOSIS — J309 Allergic rhinitis, unspecified: Secondary | ICD-10-CM | POA: Insufficient documentation

## 2015-03-21 NOTE — Telephone Encounter (Signed)
[place] [facility] [place] and needs a referral to see a dr there was last seen in office 6 years ago here

## 2015-03-21 NOTE — Telephone Encounter (Signed)
Spoke with patient who wants to speak with Janora NorlanderJeannine Moss NP.

## 2015-03-21 NOTE — Telephone Encounter (Signed)
[place]   [place]

## 2015-03-22 NOTE — Telephone Encounter (Signed)
Patient spoke with Jeannine on yesterday and was told to speak with you by Eilene GhaziJeannine

## 2015-03-22 NOTE — Telephone Encounter (Signed)
Telephone call with patient. [place] Patient asking for information for bariatric follow up in where she lives now. Advised patient to look for nearby bariatric care on the [email or web address]. Patient agreed. Advised if any questions or concerns to call the office.

## 2015-03-28 ENCOUNTER — Encounter

## 2015-04-06 ENCOUNTER — Encounter: Attending: Adult Health | Primary: Internal Medicine

## 2015-08-08 DIAGNOSIS — I10 Essential (primary) hypertension: Secondary | ICD-10-CM | POA: Insufficient documentation

## 2016-04-24 ENCOUNTER — Ambulatory Visit: Payer: Self-pay | Admitting: Internal Medicine

## 2016-07-03 DIAGNOSIS — M8589 Other specified disorders of bone density and structure, multiple sites: Secondary | ICD-10-CM | POA: Insufficient documentation

## 2016-07-03 DIAGNOSIS — Z6841 Body Mass Index (BMI) 40.0 and over, adult: Secondary | ICD-10-CM

## 2017-06-16 ENCOUNTER — Ambulatory Visit (INDEPENDENT_AMBULATORY_CARE_PROVIDER_SITE_OTHER): Payer: Medicare Other | Admitting: Podiatry

## 2017-06-16 ENCOUNTER — Ambulatory Visit: Payer: Medicare Other

## 2017-06-16 VITALS — BP 112/65 | HR 65 | Ht 65.0 in | Wt 243.0 lb

## 2017-06-16 DIAGNOSIS — M722 Plantar fascial fibromatosis: Secondary | ICD-10-CM

## 2017-06-16 DIAGNOSIS — S90221A Contusion of right lesser toe(s) with damage to nail, initial encounter: Secondary | ICD-10-CM | POA: Diagnosis not present

## 2017-06-17 NOTE — Progress Notes (Signed)
   Subjective: Patient presents today for pain and tenderness in the feet bilaterally. She states she has history of plantar fasciitis and received injections in her right foot 5 years ago. Patient states that it hurts in the morning with the first steps out of bed.  She also complains of pain to the right great toenail secondary to injuring it approximately 6 weeks ago. She has not done anything to treat the area. There are no modifying factors noted. Patient presents today for further treatment and evaluation.   No past medical history on file.   Objective: Physical Exam General: The patient is alert and oriented x3 in no acute distress.  Dermatology: Subungual hematoma noted to right great nail plate. Skin is warm, dry and supple bilateral lower extremities. Negative for open lesions or macerations bilateral.   Vascular: Dorsalis Pedis and Posterior Tibial pulses palpable bilateral.  Capillary fill time is immediate to all digits.  Neurological: Epicritic and protective threshold intact bilateral.   Musculoskeletal: Tenderness to palpation at the medial calcaneal tubercale and through the insertion of the plantar fascia of the bilateral feet. All other joints range of motion within normal limits bilateral. Strength 5/5 in all groups bilateral.    Assessment: 1. plantar fasciitis bilateral feet 2. subungual hematoma right great toe  Plan of Care:  1. Patient evaluated. Declined X-Rays.   2. Recommended good shoe gear and daily stretching. 3. Recommended antibiotic ointment and Band-Aid to right great nail plate. 4. Return to clinic when necessary.  Felecia Shelling, DPM [facility]  Dr. Felecia Shelling, DPM    [address] [place], [place] [address]                Office [phone]  Fax [phone]

## 2018-01-14 DIAGNOSIS — N3281 Overactive bladder: Secondary | ICD-10-CM | POA: Insufficient documentation

## 2018-01-14 DIAGNOSIS — N393 Stress incontinence (female) (male): Secondary | ICD-10-CM | POA: Insufficient documentation

## 2018-03-02 ENCOUNTER — Encounter (HOSPITAL_BASED_OUTPATIENT_CLINIC_OR_DEPARTMENT_OTHER): Payer: Self-pay

## 2018-03-02 ENCOUNTER — Other Ambulatory Visit: Payer: Self-pay

## 2018-03-02 ENCOUNTER — Emergency Department (HOSPITAL_BASED_OUTPATIENT_CLINIC_OR_DEPARTMENT_OTHER): Payer: Medicare Other

## 2018-03-02 ENCOUNTER — Emergency Department (HOSPITAL_BASED_OUTPATIENT_CLINIC_OR_DEPARTMENT_OTHER)
Admission: EM | Admit: 2018-03-02 | Discharge: 2018-03-02 | Disposition: A | Discharge status: Home / Self Care | Payer: Medicare Other | Attending: Emergency Medicine | Admitting: Emergency Medicine

## 2018-03-02 DIAGNOSIS — R42 Dizziness and giddiness: Secondary | ICD-10-CM | POA: Insufficient documentation

## 2018-03-02 DIAGNOSIS — Z87891 Personal history of nicotine dependence: Secondary | ICD-10-CM | POA: Diagnosis not present

## 2018-03-02 DIAGNOSIS — Z79899 Other long term (current) drug therapy: Secondary | ICD-10-CM | POA: Diagnosis not present

## 2018-03-02 DIAGNOSIS — I10 Essential (primary) hypertension: Secondary | ICD-10-CM | POA: Diagnosis not present

## 2018-03-02 DIAGNOSIS — K219 Gastro-esophageal reflux disease without esophagitis: Secondary | ICD-10-CM | POA: Diagnosis not present

## 2018-03-02 HISTORY — DX: Nonrheumatic mitral (valve) prolapse: I34.1

## 2018-03-02 HISTORY — DX: Pure hypercholesterolemia, unspecified: E78.00

## 2018-03-02 HISTORY — DX: Systemic lupus erythematosus, unspecified: M32.9

## 2018-03-02 LAB — BASIC METABOLIC PANEL
Anion gap: 8 (ref 5–15)
BUN: 11 mg/dL (ref 6–20)
CO2: 25 mmol/L (ref 22–32)
Calcium: 8.6 mg/dL — ABNORMAL LOW (ref 8.9–10.3)
Chloride: 105 mmol/L (ref 101–111)
Creatinine, Ser: 0.62 mg/dL (ref 0.44–1.00)
GLUCOSE: 124 mg/dL — AB (ref 65–99)
POTASSIUM: 3.9 mmol/L (ref 3.5–5.1)
SODIUM: 138 mmol/L (ref 135–145)

## 2018-03-02 LAB — URINALYSIS, ROUTINE W REFLEX MICROSCOPIC
Bilirubin Urine: NEGATIVE
Glucose, UA: NEGATIVE mg/dL
Hgb urine dipstick: NEGATIVE
Ketones, ur: 15 mg/dL — AB
LEUKOCYTES UA: NEGATIVE
NITRITE: NEGATIVE
PH: 6 (ref 5.0–8.0)
Protein, ur: NEGATIVE mg/dL

## 2018-03-02 LAB — CBC WITH DIFFERENTIAL/PLATELET
BASOS ABS: 0 10*3/uL (ref 0.0–0.1)
BASOS PCT: 0 %
Eosinophils Absolute: 0.1 10*3/uL (ref 0.0–0.7)
Eosinophils Relative: 2 %
HEMATOCRIT: 39.6 % (ref 36.0–46.0)
HEMOGLOBIN: 13.6 g/dL (ref 12.0–15.0)
LYMPHS PCT: 34 %
Lymphs Abs: 2.2 10*3/uL (ref 0.7–4.0)
MCH: 27.7 pg (ref 26.0–34.0)
MCHC: 34.3 g/dL (ref 30.0–36.0)
MCV: 80.7 fL (ref 78.0–100.0)
MONO ABS: 0.5 10*3/uL (ref 0.1–1.0)
Monocytes Relative: 8 %
NEUTROS ABS: 3.5 10*3/uL (ref 1.7–7.7)
NEUTROS PCT: 56 %
Platelets: 221 10*3/uL (ref 150–400)
RBC: 4.91 MIL/uL (ref 3.87–5.11)
RDW: 15 % (ref 11.5–15.5)
WBC: 6.3 10*3/uL (ref 4.0–10.5)

## 2018-03-02 LAB — TROPONIN I: Troponin I: <0.03 ng/mL (ref <0.03)

## 2018-03-02 MED ORDER — MECLIZINE HCL 25 MG PO TABS
25.0000 mg | ORAL_TABLET | Freq: Once | ORAL | Status: AC
  Administered 2018-03-02: 25 mg via ORAL
  Filled 2018-03-02: qty 1

## 2018-03-02 MED ORDER — DIAZEPAM 5 MG/ML IJ SOLN
2.5000 mg | Freq: Once | INTRAMUSCULAR | Status: AC
  Administered 2018-03-02: 2.5 mg via INTRAVENOUS
  Filled 2018-03-02: qty 2

## 2018-03-02 MED ORDER — SODIUM CHLORIDE 0.9 % IV SOLN
Freq: Once | INTRAVENOUS | Status: AC
  Administered 2018-03-02: 21:00:00 via INTRAVENOUS

## 2018-03-02 MED ORDER — FAMOTIDINE IN NACL 20-0.9 MG/50ML-% IV SOLN
20.0000 mg | Freq: Once | INTRAVENOUS | Status: AC
  Administered 2018-03-02: 20 mg via INTRAVENOUS
  Filled 2018-03-02: qty 50

## 2018-03-02 MED ORDER — DIAZEPAM 5 MG PO TABS: ORAL_TABLET | ORAL | 0 refills | Status: AC

## 2018-03-02 MED ORDER — ONDANSETRON HCL 4 MG/2ML IJ SOLN
4.0000 mg | Freq: Once | INTRAMUSCULAR | Status: AC
  Administered 2018-03-02: 4 mg via INTRAVENOUS
  Filled 2018-03-02: qty 2

## 2018-03-02 MED ORDER — FAMOTIDINE 20 MG PO TABS: 20.0000 mg | ORAL_TABLET | Freq: Two times a day (BID) | ORAL | 0 refills | Status: AC

## 2018-03-02 MED ORDER — MECLIZINE HCL 25 MG PO TABS: 25.0000 mg | ORAL_TABLET | Freq: Three times a day (TID) | ORAL | 0 refills | Status: AC | PRN

## 2018-03-02 MED ORDER — ONDANSETRON 4 MG PO TBDP: 4.0000 mg | ORAL_TABLET | ORAL | 0 refills | Status: AC | PRN

## 2018-03-02 MED ORDER — SODIUM CHLORIDE 0.9 % IV BOLUS
500.0000 mL | Freq: Once | INTRAVENOUS | Status: AC
  Administered 2018-03-02: 500 mL via INTRAVENOUS

## 2018-03-02 NOTE — ED Notes (Signed)
ED Provider at bedside. 

## 2018-03-02 NOTE — ED Triage Notes (Addendum)
C/o dizziness, minimal n/v x 1 month-has not sought medical attention until today-states sx "more severe"-NAD-steady gait

## 2018-03-02 NOTE — ED Provider Notes (Signed)
[facility] EMERGENCY DEPARTMENT Provider Note   CSN: [id number] Arrival date & time: 03/02/18  1704     History   Chief Complaint Chief Complaint  Patient presents with  . Dizziness    HPI Kristin Huffman is a 67 y.o. female.  HPI Patient reports approximately 2 weeks she has had spinning quality dizziness.  Is been most pronounced when she awakens in the morning.  She reports when she gets out of bed she has spinning of the room and feels very off-balance.  Reports she has to hold onto things for balance.  This does however seem to improve.  At that time she can begin going about regular activities.  She reports however if she bends over and stands up or turns quickly this will become worse again.  She reports she does have a prior history of vertigo treated in 2014.  At that time she describes taking some medication and doing exercises for vertigo getting significant improvement.  She reports she has had mild episodes of it but it has been much more severe over these past 2 weeks.  She reports it has not been improving and was worse today than previously.  He does not have any medication to try.  She has not tried any over-the-counter therapies.  Now, she is gotten very nauseated and has had a couple episodes of vomiting.  She reports it is clear and it just comes up to the middle of her throat.  She also notes that she feels like she is getting reflux.  She reports when she is lying flat she can feel fluid that seems to come up to the base of her throat.  She has not tried any medication for this either. Past Medical History:  Diagnosis Date  . High cholesterol   . Lupus (HCC)   . MVP (mitral valve prolapse)     Patient Active Problem List   Diagnosis Date Noted  . Morbid obesity with BMI of 40.0-44.9, adult (HCC) 07/03/2016  . Osteopenia of multiple sites 07/03/2016  . Essential hypertension 08/08/2015  . Allergic rhinitis 01/07/2014  . Impaired fasting glucose 11/01/2013   . B12 deficiency 05/04/2013  . Familial hyperlipidemia 05/04/2013  . MVP (mitral valve prolapse) 05/04/2013  . Osteoarthritis 05/04/2013  . Tinnitus 05/04/2013  . Vertigo 05/04/2013  . Vitamin D deficiency 05/04/2013  . Lupus erythematosus 03/04/2013    Past Surgical History:  Procedure Laterality Date  . ABDOMINAL HYSTERECTOMY    . CESAREAN SECTION    . CHOLECYSTECTOMY    . GASTRIC BYPASS       OB History   None      Home Medications    Prior to Admission medications   Medication Sig Start Date End Date Taking? Authorizing Provider  acetic acid-hydrocortisone (VOSOL-HC) OTIC solution Place 2 drops into both ears 2 times daily. 11/01/13  Yes [provider]  atorvastatin (LIPITOR) 40 MG tablet Lipitor 40 mg tablet  Take 1 tablet every day by oral route at bedtime.   Yes [provider]  atorvastatin (LIPITOR) 80 MG tablet atorvastatin 80 mg tablet   Yes [provider]  calcium carbonate (CALCIUM 600) 600 MG TABS tablet Take 600 mg by mouth.   Yes [provider]  Cholecalciferol (VITAMIN D-1000 MAX ST) 1000 units tablet Take by mouth. 03/21/14  Yes [provider]  clindamycin (CLEOCIN) 300 MG capsule clindamycin HCl 300 mg capsule   Yes [provider]  diclofenac (VOLTAREN) 75 MG EC  tablet diclofenac sodium 75 mg tablet,delayed release  Take 1 tablet twice a day by oral route for 30 days.   Yes [provider]  Multiple Vitamin (MULTI-VITAMINS) TABS Take by mouth.   Yes [provider]  predniSONE (DELTASONE) 20 MG tablet prednisone 20 mg tablet  Take 3 tablets every day by oral route for 6 days.   Yes [provider]  rosuvastatin (CRESTOR) 40 MG tablet Take 40 mg by mouth. 12/29/16  Yes [provider]  traMADol (ULTRAM) 50 MG tablet tramadol 50 mg tablet  Take 1 tablet every 6 hours by oral route as needed for 7 days.   Yes [provider]  diazepam (VALIUM) 5 MG tablet  1/2 to  1 tablet every 8 hours as needed for vertigo. 03/02/18   Arby Barrette, MD  famotidine (PEPCID) 20 MG tablet Take 1 tablet (20 mg total) by mouth 2 (two) times daily. 03/02/18   Arby Barrette, MD  meclizine (ANTIVERT) 25 MG tablet Take 1 tablet (25 mg total) by mouth 3 (three) times daily as needed for dizziness. 03/02/18   Arby Barrette, MD  ondansetron (ZOFRAN ODT) 4 MG disintegrating tablet Take 1 tablet (4 mg total) by mouth every 4 (four) hours as needed for nausea or vomiting. 03/02/18   Arby Barrette, MD    Family History No family history on file.  Social History Social History   Tobacco Use  . Smoking status: Former Games developer  . Smokeless tobacco: Never Used  Substance Use Topics  . Alcohol use: Not Currently  . Drug use: Never     Allergies   Iodinated diagnostic agents and Penicillins   Review of Systems Review of Systems 10 Systems reviewed and are negative for acute change except as noted in the HPI.   Physical Exam Updated Vital Signs BP (!) 141/75 (BP Location: Left Arm)   Pulse (!) 59   Temp 97.6 F (36.4 C) (Oral)   Resp 16   Ht 5\' 5"  (1.651 m)   Wt 112.2 kg (247 lb 5 oz)   SpO2 97%   BMI 41.15 kg/m   Physical Exam  Constitutional: She is oriented to person, place, and time. She appears well-developed and well-nourished.  Is alert and appropriate.  No respiratory distress.  As I come to evaluate the patient she is ambulatory from the bathroom with a coordinated gait.  HENT:  Head: Normocephalic and atraumatic.  Right Ear: External ear normal.  Left Ear: External ear normal.  Nose: Nose normal.  Mouth/Throat: Oropharynx is clear and moist.  Bilateral TMs normal.  Eyes: Pupils are equal, round, and reactive to light. EOM are normal.  With extraocular motions, patient has lateral nystagmus with fast beat to the left.  Most pronounced in horizontal planes.  Neck: Neck supple.  Cardiovascular: Normal rate, regular rhythm, normal heart  sounds and intact distal pulses.  Pulmonary/Chest: Effort normal and breath sounds normal.  Abdominal: Soft. Bowel sounds are normal. She exhibits no distension. There is no tenderness.  Musculoskeletal: Normal range of motion. She exhibits no edema.  Neurological: She is alert and oriented to person, place, and time. She has normal strength. No cranial nerve deficit. She exhibits normal muscle tone. Coordination normal. GCS eye subscore is 4. GCS verbal subscore is 5. GCS motor subscore is 6.  Normal finger-nose exam bilaterally.  Normal strength testing upper and lower extremities.  Normal speech and cognitive function.  Patient became extremely symptomatic when she was laid flat.  She could  not tolerate Dix-Hallpike maneuver.  She had to sit up and developed retching.  Skin: Skin is warm, dry and intact.  Psychiatric: She has a normal mood and affect.     ED Treatments / Results  Labs (all labs ordered are listed, but only abnormal results are displayed) Labs Reviewed  BASIC METABOLIC PANEL - Abnormal; Notable for the following components:      Result Value   Glucose, Bld 124 (*)    Calcium 8.6 (*)    All other components within normal limits  URINALYSIS, ROUTINE W REFLEX MICROSCOPIC - Abnormal; Notable for the following components:   Specific Gravity, Urine >1.030 (*)    Ketones, ur 15 (*)    All other components within normal limits  CBC WITH DIFFERENTIAL/PLATELET  TROPONIN I    EKG EKG Interpretation  Date/Time:  Monday March 02 2018 18:36:59 EDT Ventricular Rate:  57 PR Interval:    QRS Duration: 111 QT Interval:  479 QTC Calculation: 467 R Axis:   11 Text Interpretation:  Sinus rhythm normal. no old comparison Confirmed by Arby BarrettePfeiffer, Camran Keady [id number]) on 03/02/2018 10:10:01 PM   Radiology Ct Head Wo Contrast  Result Date: 03/02/2018 CLINICAL DATA:  Dizziness.  Vertigo. EXAM: CT HEAD WITHOUT CONTRAST TECHNIQUE: Contiguous axial images were obtained from the base of the  skull through the vertex without intravenous contrast. COMPARISON:  None. FINDINGS: Brain: No evidence for acute infarction, hemorrhage, mass lesion, hydrocephalus, or extra-axial fluid. Normal for age cerebral volume. Slight hypoattenuation of white matter, likely small vessel disease. Tiny periventricular calcifications on the RIGHT, see series 2, image 17, also series 4, image 30, and series 4, image 35. These likely represent sequelae of old infection. No significant associated brain substance loss. Vascular: Calcification of the cavernous internal carotid arteries consistent with cerebrovascular atherosclerotic disease. No signs of intracranial large vessel occlusion. Skull: Calvarium intact.  No fracture or worrisome osseous lesion. Sinuses/Orbits: Negative. Other: None. IMPRESSION: Tiny periventricular calcifications on the RIGHT likely sequelae of remote infection. No features to suggest an acute intracranial process on today's exam. Normal for age cerebral volume.  Mild small vessel disease. Electronically Signed   By: Elsie StainJohn T Curnes M.D.   On: 03/02/2018 18:41    Procedures Procedures (including critical care time)  Medications Ordered in ED Medications  diazepam (VALIUM) injection 2.5 mg (2.5 mg Intravenous Given 03/02/18 1758)  meclizine (ANTIVERT) tablet 25 mg (25 mg Oral Given 03/02/18 1808)  ondansetron (ZOFRAN) injection 4 mg (4 mg Intravenous Given 03/02/18 1757)  famotidine (PEPCID) IVPB 20 mg premix (0 mg Intravenous Stopped 03/02/18 1828)  0.9 %  sodium chloride infusion ( Intravenous Stopped 03/02/18 2155)  sodium chloride 0.9 % bolus 500 mL (0 mLs Intravenous Stopped 03/02/18 2043)  diazepam (VALIUM) injection 2.5 mg (2.5 mg Intravenous Given 03/02/18 2044)  meclizine (ANTIVERT) tablet 25 mg (25 mg Oral Given 03/02/18 2206)     Initial Impression / Assessment and Plan / ED Course  I have reviewed the triage vital signs and the nursing notes.  Pertinent labs & imaging results that  were available during my care of the patient were reviewed by me and considered in my medical decision making (see chart for details).  Clinical Course as of Mar 02 2209  Mon Mar 02, 2018  2017 Patient is improved.  She is alert and appropriate.  No further nausea and vomiting.  She reports she still feels some amount of spinning of the room but has had ice chips in the mint  without nausea or vomiting.  She shows no signs of somnolence or confusion.  Will add another 2.5 mg of Valium and ambulate.   [MP]  2137 Feels much improved.  She has been able to eat crackers and take fluids.   [MP]    Clinical Course User Index [MP] Arby Barrette, MD    Final Clinical Impressions(s) / ED Diagnoses   Final diagnoses:  Vertigo  Gastroesophageal reflux disease without esophagitis  Patient has been appearance seeing vertiginous symptoms scribing the room is spinning.  He had successfully treated vertigo in the past.  She went for many years without having symptoms.  Symptoms became more frequent over the past 2 weeks.  Patient's neurologic exam is normal except nystagmus with sided fast phase that was extinguishable.  Agnostic evaluation is within normal limits.  Patient had very good response to therapy.  Patient is ambulatory with stable and symmetric gait and no motor or cognitive deficits.  This time plan will be to continue treating for vertigo.  Patient is counseled on return precautions.  She is to follow-up with her PCP for recheck.  ED Discharge Orders        Ordered    meclizine (ANTIVERT) 25 MG tablet  3 times daily PRN     03/02/18 2204    diazepam (VALIUM) 5 MG tablet     03/02/18 2204    famotidine (PEPCID) 20 MG tablet  2 times daily     03/02/18 2204    ondansetron (ZOFRAN ODT) 4 MG disintegrating tablet  Every 4 hours PRN     03/02/18 2204       Arby Barrette, MD 03/02/18 2212

## 2018-03-02 NOTE — ED Notes (Signed)
Ambulated pt to restroom and back  Pt tolerated well  States feels a slight bit of dizziness but much better than when she came in

## 2018-03-02 NOTE — Discharge Instructions (Signed)
For the next 24 hours, take meclizine 1 tablet every 8 hours.  If you still feel like the room is spinning, take Valium half to 1 tablet every 8 hours as well.  Symptoms are improving significantly decrease the meclizine to 1 tablet twice daily.  You may start to discontinue the medication and only use as needed if symptoms are much improved. Make an appointment to see your family doctor this week for recheck. Return to the emergency department if your symptoms are not controlled by medications, if you develop new or changing symptoms.

## 2018-05-18 DIAGNOSIS — Z9889 Other specified postprocedural states: Secondary | ICD-10-CM | POA: Insufficient documentation

## 2018-07-01 ENCOUNTER — Ambulatory Visit (INDEPENDENT_AMBULATORY_CARE_PROVIDER_SITE_OTHER): Payer: Medicare Other | Admitting: Podiatry

## 2018-07-01 DIAGNOSIS — L603 Nail dystrophy: Secondary | ICD-10-CM | POA: Diagnosis not present

## 2018-07-05 NOTE — Progress Notes (Signed)
   Subjective: 67 year old female presenting today with a chief complaint of discoloration and loosening of the great toenail of the right foot that began about one year ago. She reports associated soreness of the nail. She also reports discoloration of the left great toenail that began three weeks ago. She has not done anything for treatment. Touching the nail increases the pain. Patient is here for further evaluation and treatment.   Past Medical History:  Diagnosis Date  . High cholesterol   . Lupus (HCC)   . MVP (mitral valve prolapse)     Objective:  General: Well developed, nourished, in no acute distress, alert and oriented x3   Dermatology: Hyperkeratotic, discolored, thickened, onychodystrophy of the right great toenail. Skin is warm, dry and supple bilateral lower extremities. Negative for open lesions or macerations.  Vascular: Dorsalis Pedis artery and Posterior Tibial artery pedal pulses palpable. No lower extremity edema noted.   Neruologic: Grossly intact via light touch bilateral.  Musculoskeletal: Muscular strength within normal limits in all groups bilateral. Normal range of motion noted to all pedal and ankle joints.   Assessment:  #1 Dystrophic nail of the right great toenail  Plan of Care:  1. Patient evaluated.  2. Discussed treatment alternatives and plan of care. Explained nail avulsion procedure and post procedure course to patient. 3. Patient opted for total temporary nail avulsion.  4. Prior to procedure, local anesthesia infiltration utilized using 3 ml of a 50:50 mixture of 2% plain lidocaine and 0.5% plain marcaine in a normal hallux block fashion and a betadine prep performed.  5. Light dressing applied. 6. Return to clinic as needed.   Felecia Shelling, DPM [facility]  Dr. Felecia Shelling, DPM    [address]                                        [place], [place] [address]                Office [phone]  Fax [phone]

## 2018-11-13 ENCOUNTER — Telehealth: Payer: Self-pay | Admitting: Podiatry

## 2018-11-13 NOTE — Telephone Encounter (Signed)
I called pt and asked if she had reviewed the message I left and had any questions I could help her with. Pt stated the little toe does look a little further from the foot than normal but not discoloration or swelling. I told pt the swelling could be inside that was why the to was standing out, to wear a enclosed soft shoe so as not to risk catching the toe again, rest, ice protecting the skin with cloth, and elevate. Pt asked if compression would help, I told her she may not be able to tolerated the pressure. Pt asked if she could use heat and I told her not the 1st 72 hours.

## 2018-11-13 NOTE — Telephone Encounter (Signed)
I just missed a call from the nurse. If you could please call me back.

## 2018-11-13 NOTE — Telephone Encounter (Signed)
Pt called wanting to speak with the nurse regarding her right foot. Pt jammed her foot on a chair last night and would like to know what signs she should look for to know if something is broken. Pt was offered appt today, 11/13/18 at 11:00AM but was not able to come in at that time. Pt is scheduled for appt on 11/16/18 at 2:45PM.  Please give patient a call back.

## 2018-11-13 NOTE — Telephone Encounter (Signed)
Left message informing pt fracture symptoms may be vague, could have swelling, discoloration, pain or in deformity of the site, wear a soft topped of shoe with a stiff sole, rest, ice and elevate.

## 2018-11-16 ENCOUNTER — Ambulatory Visit (INDEPENDENT_AMBULATORY_CARE_PROVIDER_SITE_OTHER): Payer: Medicare Other

## 2018-11-16 ENCOUNTER — Ambulatory Visit: Payer: Medicare Other | Admitting: Podiatry

## 2018-11-16 ENCOUNTER — Encounter: Payer: Self-pay | Admitting: Podiatry

## 2018-11-16 DIAGNOSIS — S9031XA Contusion of right foot, initial encounter: Secondary | ICD-10-CM

## 2018-11-16 DIAGNOSIS — M2042 Other hammer toe(s) (acquired), left foot: Secondary | ICD-10-CM | POA: Diagnosis not present

## 2018-11-16 DIAGNOSIS — S92504A Nondisplaced unspecified fracture of right lesser toe(s), initial encounter for closed fracture: Secondary | ICD-10-CM | POA: Diagnosis not present

## 2018-11-19 NOTE — Progress Notes (Signed)
   HPI: 68 year old female presenting today with a chief complaint of aching pain noted to toes 2-5 of the right foot that began 3 days ago. She states she stumped the toes which started the pain. She reports associated bruising and swelling. She has been icing, soaking and elevating the toes for treatment. There are no worsening factors noted. Patient is here for further evaluation and treatment.   Past Medical History:  Diagnosis Date  . High cholesterol   . Lupus (HCC)   . MVP (mitral valve prolapse)       Objective: Physical Exam General: The patient is alert and oriented x3 in no acute distress.  Dermatology: Skin is cool, dry and supple bilateral lower extremities. Negative for open lesions or macerations.  Vascular: Palpable pedal pulses bilaterally. No edema or erythema noted. Capillary refill within normal limits.  Neurological: Epicritic and protective threshold grossly intact bilaterally.   Musculoskeletal Exam: All pedal and ankle joints range of motion within normal limits bilateral. Muscle strength 5/5 in all groups bilateral. Hammertoe contracture deformity noted to digits 1-5 of the left foot. Pain with palpation noted to the right fourth toe.   Radiographic Exam: Mildly displaced fracture of the proximal phalanx of the right fourth toe.    Assessment: 1. Fracture right fourth toe, proximal phalanx, mildly displaced 2. 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 Patient evaluated. X-Rays reviewed. 2. Post op shoes for right foot dispensed. For three weeks.  3. Recommended conservative treatment of hammertoes at this time. Recommended extra depth shoes.  4. Return to clinic as needed.      Felecia Shelling, DPM [facility]  Dr. Felecia Shelling, DPM    [address] [place], [place] [address]                Office [phone]  Fax [phone]

## 2018-11-29 IMAGING — CT CT HEAD W/O CM
3 series · 14 of 46 positions shown, 16 images · non-contrast
Comparison: None.

CLINICAL DATA: Dizziness.  Vertigo.

EXAM:
CT HEAD WITHOUT CONTRAST
TECHNIQUE: Contiguous axial images were obtained from the base of the skull
through the vertex without intravenous contrast.

[Series 2: head wo · axial · 0.42mm/px · z∈[-112,+8]mm · 8 of 29 slices shown, 10 images]
[im 3/29  brain]
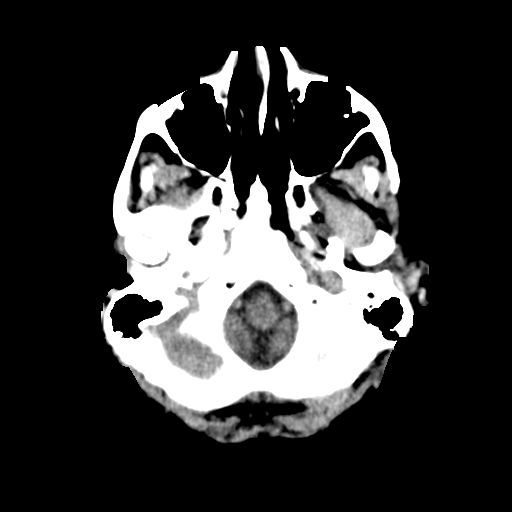
[im 3/29  bone]
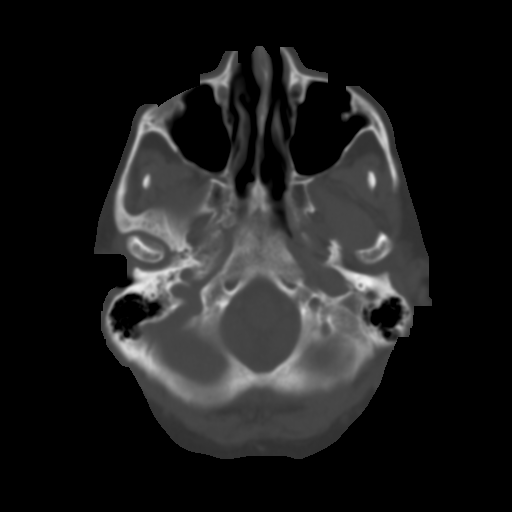
[im 7/29  brain]
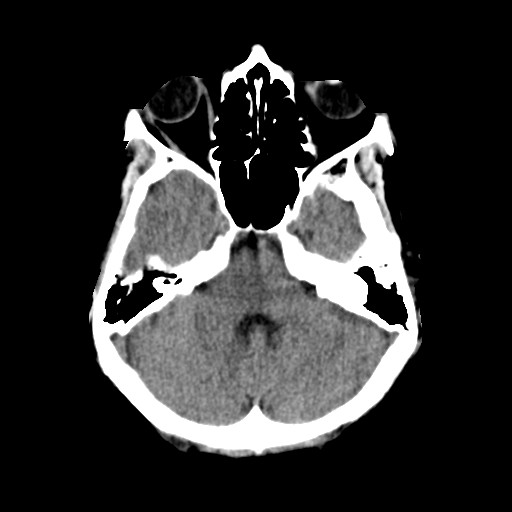
[im 10/29  brain]
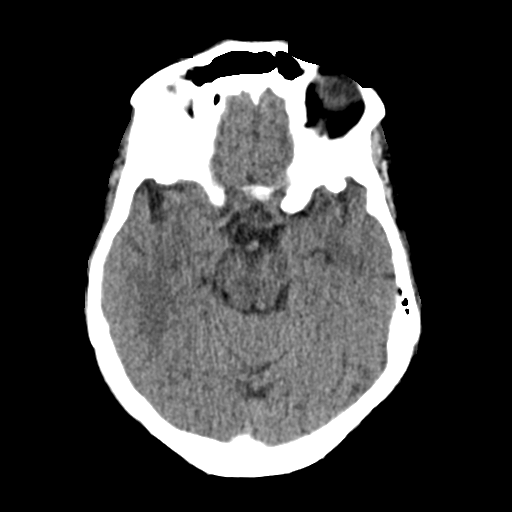
[im 13/29  brain]
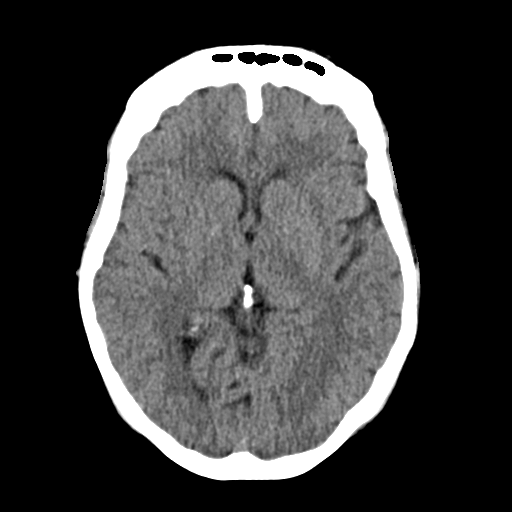
[im 17/29  brain]
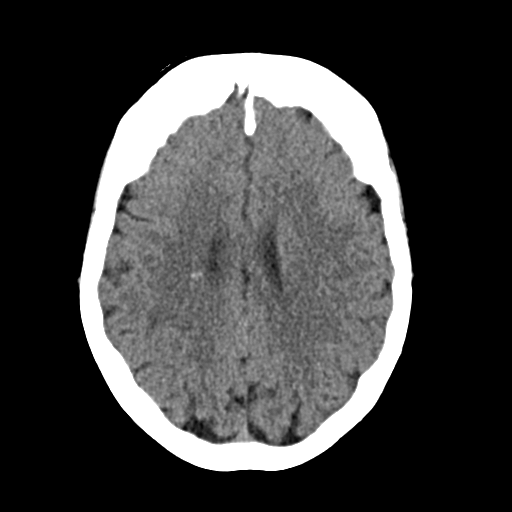
[im 17/29  bone]
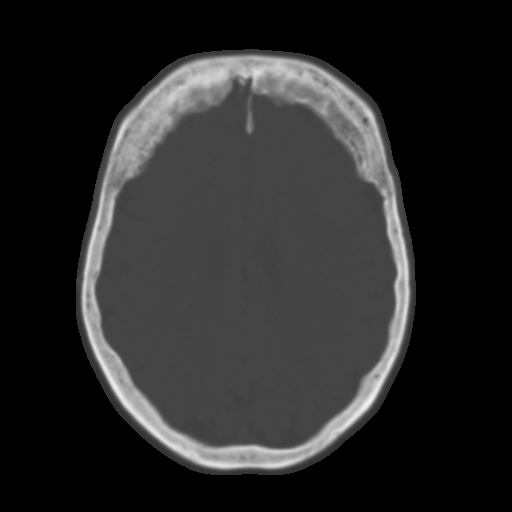
[im 20/29  brain]
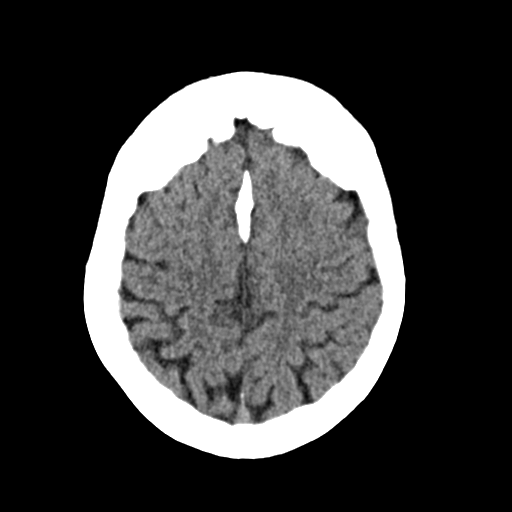
[im 23/29  brain]
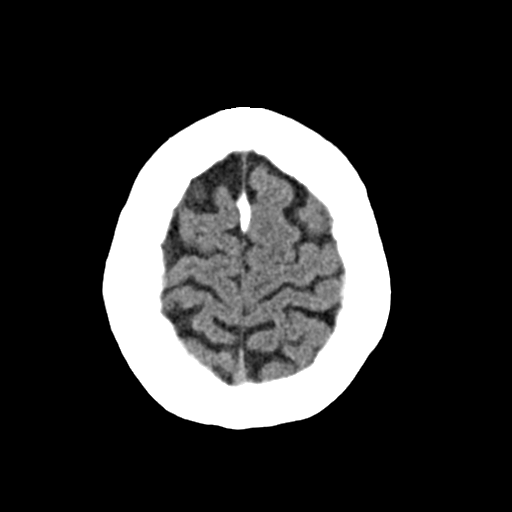
[im 27/29  brain]
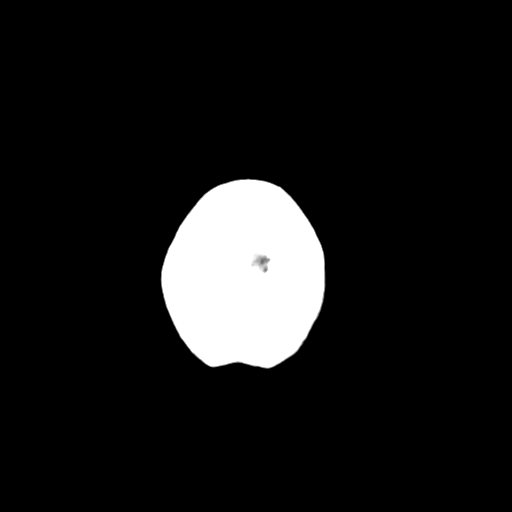

[Series 4: coronal soft · coronal · 0.30mm/px · 3 of 67 slices shown]
[im 23/67  brain]
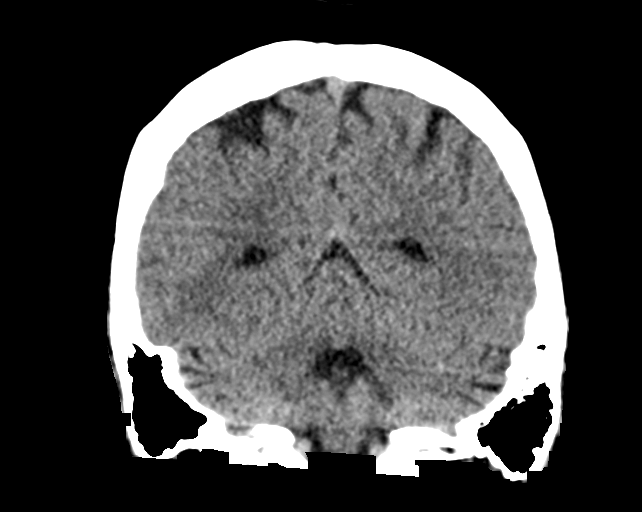
[im 30/67  brain]
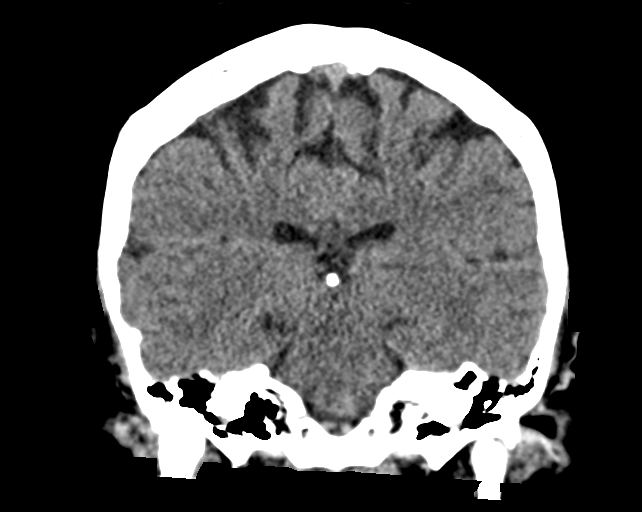
[im 37/67  brain]
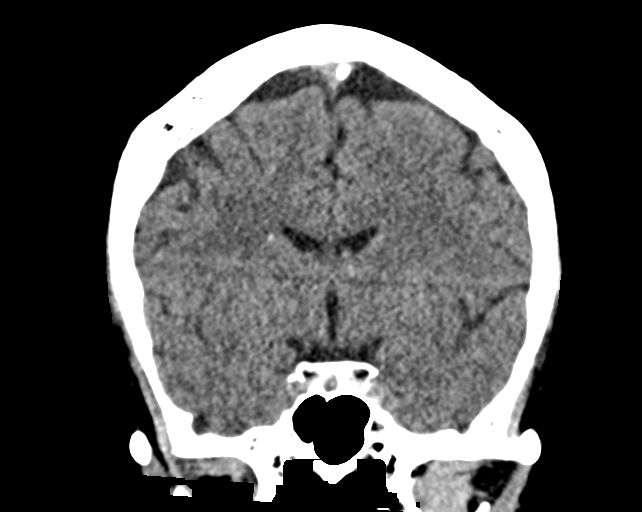

[Series 5: sag soft · sagittal · 0.29mm/px · 3 of 57 slices shown]
[im 19/57  brain]
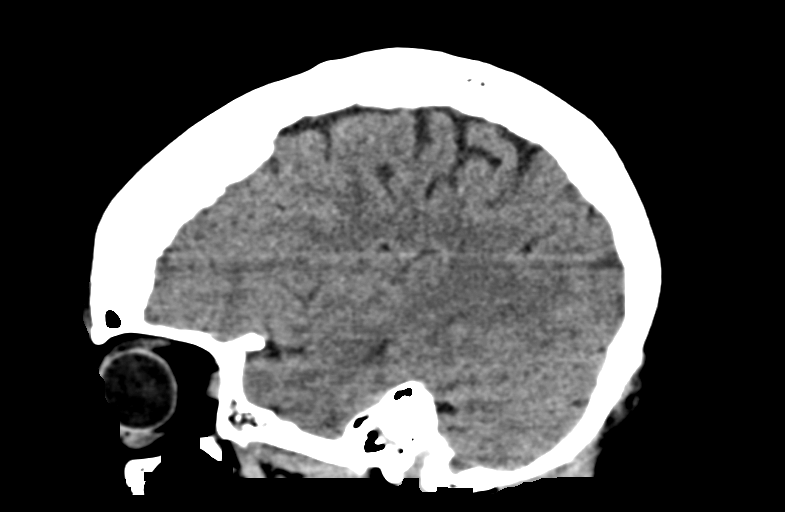
[im 29/57  brain]
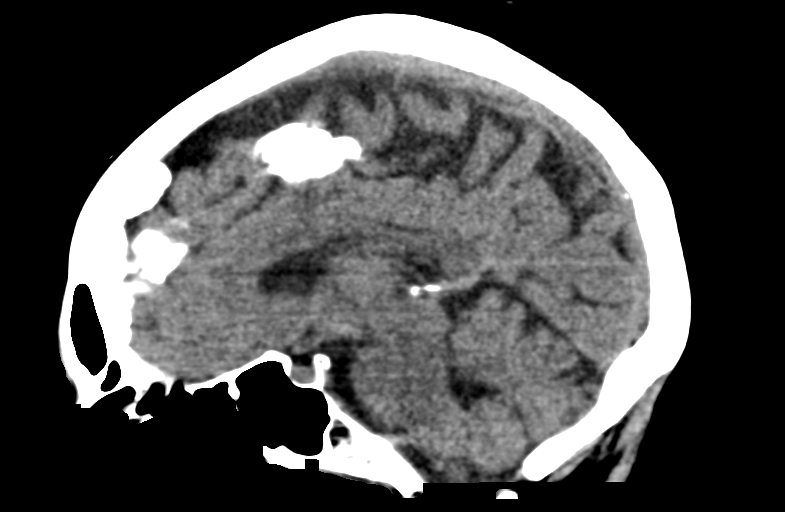
[im 38/57  brain]
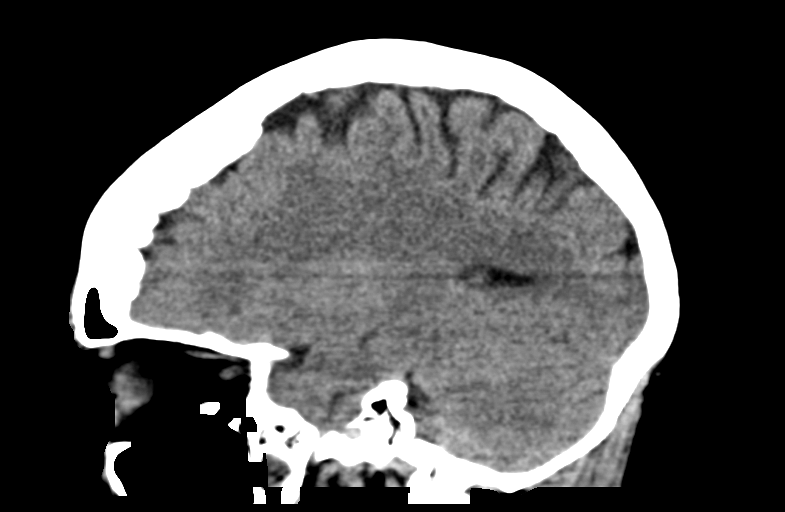

[14 of 46 positions shown; findings below may reference images not displayed]

FINDINGS: Brain: No evidence for acute infarction, hemorrhage, mass lesion,
hydrocephalus, or extra-axial fluid. Normal for age cerebral volume.
Slight hypoattenuation of white matter, likely small vessel disease.

Tiny periventricular calcifications on the RIGHT, see series 2,
image 17, also series 4, image 30, and series 4, image 35. These
likely represent sequelae of old infection. No significant
associated brain substance loss.

Vascular: Calcification of the cavernous internal carotid arteries
consistent with cerebrovascular atherosclerotic disease. No signs of
intracranial large vessel occlusion.

Skull: Calvarium intact.  No fracture or worrisome osseous lesion.

Sinuses/Orbits: Negative.

Other: None.
IMPRESSION: Tiny periventricular calcifications on the RIGHT likely sequelae of
remote infection. No features to suggest an acute intracranial
process on today's exam.

Normal for age cerebral volume.  Mild small vessel disease.

## 2019-10-05 ENCOUNTER — Ambulatory Visit: Payer: Medicare Other

## 2019-10-05 ENCOUNTER — Encounter (HOSPITAL_COMMUNITY): Payer: Self-pay

## 2019-10-05 ENCOUNTER — Emergency Department (HOSPITAL_COMMUNITY)
Admission: EM | Admit: 2019-10-05 | Discharge: 2019-10-05 | Disposition: A | Discharge status: Home / Self Care | Payer: Medicare Other | Attending: Emergency Medicine | Admitting: Emergency Medicine

## 2019-10-05 ENCOUNTER — Emergency Department (HOSPITAL_COMMUNITY): Payer: Medicare Other

## 2019-10-05 DIAGNOSIS — M25511 Pain in right shoulder: Secondary | ICD-10-CM | POA: Diagnosis not present

## 2019-10-05 DIAGNOSIS — Z87891 Personal history of nicotine dependence: Secondary | ICD-10-CM | POA: Diagnosis not present

## 2019-10-05 DIAGNOSIS — Y929 Unspecified place or not applicable: Secondary | ICD-10-CM | POA: Diagnosis not present

## 2019-10-05 DIAGNOSIS — M3210 Systemic lupus erythematosus, organ or system involvement unspecified: Secondary | ICD-10-CM | POA: Insufficient documentation

## 2019-10-05 DIAGNOSIS — S161XXA Strain of muscle, fascia and tendon at neck level, initial encounter: Secondary | ICD-10-CM

## 2019-10-05 DIAGNOSIS — Z79899 Other long term (current) drug therapy: Secondary | ICD-10-CM | POA: Diagnosis not present

## 2019-10-05 DIAGNOSIS — Y939 Activity, unspecified: Secondary | ICD-10-CM | POA: Diagnosis not present

## 2019-10-05 DIAGNOSIS — S20211A Contusion of right front wall of thorax, initial encounter: Secondary | ICD-10-CM

## 2019-10-05 DIAGNOSIS — T07XXXA Unspecified multiple injuries, initial encounter: Secondary | ICD-10-CM | POA: Diagnosis present

## 2019-10-05 DIAGNOSIS — I10 Essential (primary) hypertension: Secondary | ICD-10-CM | POA: Diagnosis not present

## 2019-10-05 DIAGNOSIS — Y999 Unspecified external cause status: Secondary | ICD-10-CM | POA: Insufficient documentation

## 2019-10-05 DIAGNOSIS — M25562 Pain in left knee: Secondary | ICD-10-CM | POA: Diagnosis not present

## 2019-10-05 MED ORDER — ACETAMINOPHEN 500 MG PO TABS
1000.0000 mg | ORAL_TABLET | Freq: Once | ORAL | Status: AC
  Administered 2019-10-05: 1000 mg via ORAL
  Filled 2019-10-05: qty 2
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
## 2019-10-05 NOTE — ED Notes (Signed)
Patient verbalizes understanding of discharge instructions. Opportunity for questioning and answers were provided. Armband removed by staff. Patient discharged from ED. Signature pad unavailable.  

## 2019-10-05 NOTE — ED Notes (Signed)
Pt back from CT

## 2019-10-05 NOTE — ED Notes (Signed)
Pt reporting pain/tenderness in her left forearm.
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
## 2019-10-14 ENCOUNTER — Ambulatory Visit: Payer: Medicare Other | Attending: Internal Medicine

## 2019-10-14 DIAGNOSIS — Z23 Encounter for immunization: Secondary | ICD-10-CM | POA: Insufficient documentation

## 2019-10-14 NOTE — Progress Notes (Signed)
   Covid-19 Vaccination Clinic  Name:  Jeroline Wolbert    MRN: [id number] DOB: [birth year]  10/14/2019  Ms. Catanese was observed post Covid-19 immunization for 15 minutes without incidence. She was provided with Vaccine Information Sheet and instruction to access the V-Safe system.   Ms. Lahman was instructed to call 911 with any severe reactions post vaccine: Marland Kitchen Difficulty breathing  . Swelling of your face and throat  . A fast heartbeat  . A bad rash all over your body  . Dizziness and weakness    Immunizations Administered    Name Date Dose VIS Date Route   Pfizer COVID-19 Vaccine 10/14/2019  2:11 PM 0.3 mL 08/20/2019 Intramuscular   Manufacturer: [employer], [employer]   Lot: [id number]   NDC: [address]-5

## 2019-11-08 ENCOUNTER — Ambulatory Visit: Payer: Medicare Other | Attending: Internal Medicine

## 2019-11-08 ENCOUNTER — Other Ambulatory Visit: Payer: Self-pay

## 2019-11-08 DIAGNOSIS — Z23 Encounter for immunization: Secondary | ICD-10-CM | POA: Insufficient documentation

## 2019-11-08 NOTE — Progress Notes (Signed)
   Covid-19 Vaccination Clinic  Name:  Kristin Huffman    MRN: [id number] DOB: [birth year]  11/08/2019  [name] was observed post Covid-19 immunization for 15 minutes without incidence. She was provided with Vaccine Information Sheet and instruction to access the V-Safe system.   [name] was instructed to call 911 with any severe reactions post vaccine: Marland Kitchen Difficulty breathing  . Swelling of your face and throat  . A fast heartbeat  . A bad rash all over your body  . Dizziness and weakness    Immunizations Administered    Name Date Dose VIS Date Route   Pfizer COVID-19 Vaccine 11/08/2019  3:43 PM 0.3 mL 08/20/2019 Intramuscular   Manufacturer: [employer], [employer]   Lot: [id number]   NDC: [address]-7

## 2020-02-14 ENCOUNTER — Telehealth: Payer: Self-pay | Admitting: Podiatry
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
## 2022-06-26 ENCOUNTER — Ambulatory Visit: Payer: Medicare Other | Admitting: Podiatry

## 2022-07-03 ENCOUNTER — Ambulatory Visit (INDEPENDENT_AMBULATORY_CARE_PROVIDER_SITE_OTHER): Payer: Medicare Other

## 2022-07-03 ENCOUNTER — Ambulatory Visit: Payer: Medicare Other | Admitting: Podiatry

## 2022-07-03 DIAGNOSIS — M2042 Other hammer toe(s) (acquired), left foot: Secondary | ICD-10-CM

## 2022-07-10 NOTE — Progress Notes (Signed)
   Chief Complaint  Patient presents with   Hammer Toe    Patient is here for left foot hammertoe.    HPI: 71 y.o. female presenting today for evaluation of the hammertoe to the left foot.  Ongoing for several years.  Minimally symptomatic.  She denies a history of injury.  Gradual onset.  She has not done anything for treatment.  Past Medical History:  Diagnosis Date   High cholesterol    Lupus ([place])    MVP (mitral valve prolapse)    Past Surgical History:  Procedure Laterality Date   ABDOMINAL HYSTERECTOMY     CESAREAN SECTION     CHOLECYSTECTOMY     GASTRIC BYPASS     Allergies  Allergen Reactions   Iodinated Contrast Media Rash    Other reaction(s): Fever (intolerance) Other reaction(s): Fever (intolerance)   Penicillins Rash    Other reaction(s): Chills (intolerance), Lethargy (intolerance)     Objective: Physical Exam General: The patient is alert and oriented x3 in no acute distress.  Dermatology: Skin is cool, dry and supple bilateral lower extremities. Negative for open lesions or macerations.  Vascular: Palpable pedal pulses bilaterally. No edema or erythema noted. Capillary refill within normal limits.  Neurological: Epicritic and protective threshold grossly intact bilaterally.   Musculoskeletal Exam: All pedal and ankle joints range of motion within normal limits bilateral. Muscle strength 5/5 in all groups bilateral. Hammertoe contracture deformity noted to digits the lesser digits of the left foot  Radiographic Exam 07/03/2022 LT foot: Hammertoe contracture deformity noted to the interphalangeal joints and MPJ of the respective hammertoe digits mentioned on clinical musculoskeletal exam.     Assessment: 1.  Hammertoes left foot; minimally symptomatic   Plan of Care:  1. Patient evaluated. X-Rays reviewed.  2.  Recommend good supportive shoes and sneakers that do not irritate or constrict the toebox area and allow plenty of room for the toes 3.   Advised against going barefoot 4.  Recommend arch supports 5.  Return to clinic as needed    Edrick Kins, DPM [facility]  Dr. Edrick Kins, DPM    [address], [place] [address]                Office [phone]  Fax [phone]
